# Patient Record
Sex: Female | Born: 1990 | Race: Black or African American | Hispanic: No | Marital: Single | Smoking: Never smoker
Health system: Southern US, Community
[De-identification: ages and names within clinical notes are randomized; demographics above are authoritative.]

## PROBLEM LIST (undated history)

## (undated) DIAGNOSIS — I10 Essential (primary) hypertension: Secondary | ICD-10-CM

## (undated) HISTORY — DX: Essential (primary) hypertension: I10

## (undated) HISTORY — PX: NO PAST SURGERIES: SHX2092

---

## 2015-03-14 ENCOUNTER — Encounter (HOSPITAL_COMMUNITY): Payer: Self-pay

## 2015-03-14 ENCOUNTER — Emergency Department (HOSPITAL_COMMUNITY)
Admission: EM | Admit: 2015-03-14 | Discharge: 2015-03-14 | Disposition: A | Discharge status: Home / Self Care | Payer: Self-pay | Attending: Emergency Medicine | Admitting: Emergency Medicine

## 2015-03-14 DIAGNOSIS — T7840XA Allergy, unspecified, initial encounter: Secondary | ICD-10-CM | POA: Insufficient documentation

## 2015-03-14 DIAGNOSIS — Y9289 Other specified places as the place of occurrence of the external cause: Secondary | ICD-10-CM | POA: Insufficient documentation

## 2015-03-14 DIAGNOSIS — Y998 Other external cause status: Secondary | ICD-10-CM | POA: Insufficient documentation

## 2015-03-14 DIAGNOSIS — X58XXXA Exposure to other specified factors, initial encounter: Secondary | ICD-10-CM | POA: Insufficient documentation

## 2015-03-14 DIAGNOSIS — Y9389 Activity, other specified: Secondary | ICD-10-CM | POA: Insufficient documentation

## 2015-03-14 MED ORDER — EPINEPHRINE 0.3 MG/0.3ML IJ SOAJ: 0.3000 mg | Freq: Once | INTRAMUSCULAR | Status: AC

## 2015-03-14 MED ORDER — DIPHENHYDRAMINE HCL 50 MG/ML IJ SOLN
25.0000 mg | Freq: Once | INTRAMUSCULAR | Status: AC
  Administered 2015-03-14: 25 mg via INTRAVENOUS
  Filled 2015-03-14: qty 1

## 2015-03-14 MED ORDER — METHYLPREDNISOLONE SODIUM SUCC 125 MG IJ SOLR
125.0000 mg | Freq: Once | INTRAMUSCULAR | Status: AC
  Administered 2015-03-14: 125 mg via INTRAVENOUS
  Filled 2015-03-14: qty 2

## 2015-03-14 MED ORDER — PREDNISONE 10 MG PO TABS: ORAL_TABLET | ORAL | Status: AC

## 2015-03-14 NOTE — ED Provider Notes (Signed)
CSN: [id number]     Arrival date & time 03/14/15  1328 History   First MD Initiated Contact with Patient 03/14/15 1354     Chief Complaint  Patient presents with  . Oral Swelling     (Consider location/radiation/quality/duration/timing/severity/associated sxs/prior Treatment) HPI Comments: Pt comes in with c/o possible allergic reaction. She states that she at peanuts last night and woke up in the middle of the night last night and had swelling to her upper lip and felt like she has swelling in her throats. Denies difficulty swallowing. She states that she has not had reaction previously. Hasn't taken anything for the symptoms. Didn't take any medications for the symptoms. Doesn't take any blood pressure medication  The history is provided by the patient. No language interpreter was used.    History reviewed. No pertinent past medical history. History reviewed. No pertinent past surgical history. History reviewed. No pertinent family history. Social History  Substance Use Topics  . Smoking status: Never Smoker   . Smokeless tobacco: None  . Alcohol Use: Yes     Comment: social   OB History    No data available     Review of Systems  All other systems reviewed and are negative.     Allergies  Review of patient's allergies indicates no known allergies.  Home Medications   Prior to Admission medications   Not on File   BP 147/102 mmHg  Pulse 76  Temp(Src) 98.6 F (37 C) (Oral)  Resp 20  SpO2 100%  LMP 03/10/2015 Physical Exam  Constitutional: She is oriented to person, place, and time. She appears well-developed and well-nourished.  HENT:  Right Ear: External ear normal.  Left Ear: External ear normal.  Upper lip swelling noted. No tongue or oral mucosal swelling  Cardiovascular: Normal rate and regular rhythm.   Pulmonary/Chest: Effort normal and breath sounds normal. She has no wheezes. She has no rales.  Musculoskeletal: Normal range of motion.   Neurological: She is alert and oriented to person, place, and time.  Skin: Skin is warm and dry.  Psychiatric: She has a normal mood and affect.  Nursing note and vitals reviewed.   ED Course  Procedures (including critical care time) Labs Review Labs Reviewed - No data to display  Imaging Review No results found. I have personally reviewed and evaluated these images and lab results as part of my medical decision-making.   EKG Interpretation None      MDM   Final diagnoses:  Allergic reaction, initial encounter    Pt feeling a lot better at this time and the swelling is getting better. Discussed return precautions with pt and discussed follow up with allergist. Will send home with script for prednisone and epipen. No respiratory distress    Teressa LowerVrinda Sidni Fusco, NP 03/14/15 1508  Lorre NickAnthony Allen, MD 03/18/15 2256

## 2015-03-14 NOTE — Discharge Instructions (Signed)
Return as discussed for any worsening symptoms. You can take benadryl as discussed Allergy Skin Testing WHY AM I HAVING THIS TEST? Allergy skin testing is done to check whether you have an allergy to something. Testing may be done in one of two ways:  Injecting a small amount of the substance you may be allergic to (allergen).  Applying patches to your skin. Your health care provider will determine the results of your test by checking for an allergic reaction on the skin where the allergen was injected or where the patches were applied.  HOW DO I PREPARE FOR THE TEST?  Let your health care provider know about all medicines you are taking, including vitamins, herbs, eye drops, creams, and over-the-counter medicines. Some medicines can affect test results. Your health care provider will let you know when to stop taking those medicines and when you can begin taking them again.  If you are having a patch test:  Do not apply ointments, creams, or lotion to the skin where the patch will be placed. Usually, the patches are placed on your forearm or on your back.  Bring any items that you think you are allergic to, such as cosmetics, soaps, and perfume. WILL I NEED TO DO ANYTHING AT HOME? If you will receive an injection, you will not need to do anything at home. If patches will be applied to your skin, you will need to:  Wear them for 48 hours.  Return to your health care provider's office to have them removed. Do not remove them yourself.  Avoid bathing and activities that cause heavy sweating until after the patches are removed. WHAT ARE THE REFERENCE RANGES? Reference ranges are considered healthy ranges established after testing a large group of healthy people. Reference ranges may vary among different people, labs, and hospitals.  The reference range for allergy skin testing is a swollen area of skin (wheal) less than 65m in diameter, with surrounding redness and swelling (flare) less than  120min diameter.  WHAT DO THE RESULTS MEAN?  A result within the reference range means you are probably not allergic to the allergen.  A result in which the wheal is 50m650mr more and the flare is 34m15m more means you are likely allergic to the allergen. Your health care provider will consider the results of your test in addition to your symptoms before diagnosing you with an allergy. Talk with your health care provider to discuss your results, treatment options, and if necessary, the need for more tests. Talk with your health care provider if you have any questions about your results.   This information is not intended to replace advice given to you by your health care provider. Make sure you discuss any questions you have with your health care provider.   Document Released: 04/08/2004 Document Revised: 04/06/2014 Document Reviewed: 12/26/2013 Elsevier Interactive Patient Education 2016 Else[employer].  Epinephrine Injection Epinephrine is a medicine given by injection to temporarily treat an emergency allergic reaction. It is also used to treat severe asthmatic attacks and other lung problems. The medicine helps to enlarge (dilate) the small breathing tubes of the lungs. A life-threatening, sudden allergic reaction that involves the whole body is called anaphylaxis. Because of potential side effects, epinephrine should only be used as directed by your caregiver. RISKS AND COMPLICATIONS Possible side effects of epinephrine injections include:  Chest pain.  Irregular or rapid heartbeat.  Shortness of breath.  Nausea.  Vomiting.  Abdominal pain or cramping.  Sweating.  Dizziness.  Weakness.  Headache.  Nervousness. Report all side effects to your caregiver. HOW TO GIVE AN EPINEPHRINE INJECTION Give the epinephrine injection immediately when symptoms of a severe reaction begin. Inject the medicine into the outer thigh or any available, large muscle. Your caregiver can teach you  how to do this. You do not need to remove any clothing. After the injection, call your local emergency services (911 in [place] Even if you improve after the injection, you need to be examined at a hospital emergency department. Epinephrine works quickly, but it also wears off quickly. Delayed reactions can occur. A delayed reaction may be as serious and dangerous as the initial reaction. HOME CARE INSTRUCTIONS  Make sure you and your family know how to give an epinephrine injection.  Use epinephrine injections as directed by your caregiver. Do not use this medicine more often or in larger doses than prescribed.  Always carry your epinephrine injection or anaphylaxis kit with you. This can be lifesaving if you have a severe reaction.  Store the medicine in a cool, dry place. If the medicine becomes discolored or cloudy, dispose of it properly and replace it with new medicine.  Check the expiration date on your medicine. It may be unsafe to use medicines past their expiration date.  Tell your caregiver about any other medicines you are taking. Some medicines can react badly with epinephrine.  Tell your caregiver about any medical conditions you have, such as diabetes, high blood pressure (hypertension), heart disease, irregular heartbeats, or if you are pregnant. SEEK IMMEDIATE MEDICAL CARE IF:  You have used an epinephrine injection. Call your local emergency services (911 in [place] Even if you improve after the injection, you need to be examined at a hospital emergency department to make sure your allergic reaction is under control. You will also be monitored for adverse effects from the medicine.  You have chest pain.  You have irregular or fast heartbeats.  You have shortness of breath.  You have severe headaches.  You have severe nausea, vomiting, or abdominal cramps.  You have severe pain, swelling, or redness in the area where you gave the injection.   This information is not  intended to replace advice given to you by your health care provider. Make sure you discuss any questions you have with your health care provider.   Document Released: 03/13/2000 Document Revised: 06/08/2011 Document Reviewed: 10/03/2014 Elsevier Interactive Patient Education [employer].

## 2015-03-14 NOTE — ED Notes (Signed)
Pt states ate peanuts last night.  Pt started having swelling on upper lip and noticed swelling in throat.  Pt woke up this morning and swelling has worsened.  States difficulty with swallowing.  Pt able to speak in full sentences without shortness of breath.  Has eaten peanut butter in past but rarely eats straight peanuts.  Unknown for previous allergy.

## 2017-01-20 ENCOUNTER — Encounter (HOSPITAL_COMMUNITY): Payer: Self-pay | Admitting: Emergency Medicine

## 2017-01-20 ENCOUNTER — Emergency Department (HOSPITAL_COMMUNITY)
Admission: EM | Admit: 2017-01-20 | Discharge: 2017-01-20 | Disposition: A | Discharge status: Home / Self Care | Payer: Self-pay | Attending: Physician Assistant | Admitting: Physician Assistant

## 2017-01-20 DIAGNOSIS — Z23 Encounter for immunization: Secondary | ICD-10-CM | POA: Insufficient documentation

## 2017-01-20 DIAGNOSIS — L0291 Cutaneous abscess, unspecified: Secondary | ICD-10-CM

## 2017-01-20 DIAGNOSIS — L02512 Cutaneous abscess of left hand: Secondary | ICD-10-CM | POA: Insufficient documentation

## 2017-01-20 MED ORDER — TETANUS-DIPHTH-ACELL PERTUSSIS 5-2.5-18.5 LF-MCG/0.5 IM SUSP
0.5000 mL | Freq: Once | INTRAMUSCULAR | Status: AC
  Administered 2017-01-20: 0.5 mL via INTRAMUSCULAR
  Filled 2017-01-20: qty 0.5

## 2017-01-20 MED ORDER — LIDOCAINE HCL (PF) 2 % IJ SOLN
INTRAMUSCULAR | Status: AC
  Administered 2017-01-20: 100 mg
  Filled 2017-01-20: qty 10

## 2017-01-20 MED ORDER — LIDOCAINE HCL 2 % IJ SOLN
5.0000 mL | Freq: Once | INTRAMUSCULAR | Status: AC
  Administered 2017-01-20: 100 mg

## 2017-01-20 NOTE — ED Notes (Signed)
Pt ambulatory and independent at discharge.  Verbalized understanding of discharge instructions 

## 2017-01-20 NOTE — ED Triage Notes (Signed)
Patient states she cut her pinky finger. This happened about three weeks ago. The cut turned into a boil.

## 2017-01-20 NOTE — ED Provider Notes (Signed)
[facility]-EMERGENCY DEPT Provider Note   CSN: [id number] Arrival date & time: 01/20/17  1833     History   Chief Complaint Chief Complaint  Patient presents with  . Recurrent Skin Infections    left pinky finger    HPI Darlene Hughes is a 26 y.o. left hand dominant female presents to the emergency department today for abscess on the left pinky finger.  She notes that she slammed her finger in a 3 weeks ago and had a pinching/cut of the skin.  Since then she has had increased swelling and now has a boil over her left pinky finger distal to the DIP and on the ulnar aspect of the finger.  She denies any surrounding erythema.  She denies any decreased range of motion, painful range of motion, numbness, tingling, drainage.  Patient tetanus status unknown.   HPI  No past medical history on file.  There are no active problems to display for this patient.   No past surgical history on file.  OB History    No data available       Home Medications    Prior to Admission medications   Medication Sig Start Date End Date Taking? Authorizing Provider  EPINEPHrine (EPIPEN 2-PAK) 0.3 mg/0.3 mL IJ SOAJ injection Inject 0.3 mLs (0.3 mg total) into the muscle once. 03/14/15   Teressa Lower, NP  predniSONE (DELTASONE) 10 MG tablet 6 day step down dose 03/14/15   Teressa Lower, NP    Family History No family history on file.  Social History Social History  Substance Use Topics  . Smoking status: Never Smoker  . Smokeless tobacco: Never Used  . Alcohol use Yes     Comment: social     Allergies   Patient has no known allergies.   Review of Systems Review of Systems  Constitutional: Negative for chills and fever.  Gastrointestinal: Negative for nausea and vomiting.  Musculoskeletal: Negative for arthralgias, gait problem and joint swelling.  Skin:       Abscess  Neurological: Negative for weakness and numbness.    Physical Exam Updated  Vital Signs BP (!) 149/99 (BP Location: Left Arm)   Pulse 79   Temp 99.1 F (37.3 C) (Oral)   Resp 18   Ht 5\' 2"  (1.575 m)   Wt 97 kg (213 lb 12.8 oz)   LMP 01/20/2017   SpO2 100%   BMI 39.10 kg/m   Physical Exam  Constitutional: She appears well-developed and well-nourished.  HENT:  Head: Normocephalic and atraumatic.  Right Ear: External ear normal.  Left Ear: External ear normal.  Eyes: Conjunctivae are normal. Right eye exhibits no discharge. Left eye exhibits no discharge. No scleral icterus.  Cardiovascular:  Pulses:      Radial pulses are 2+ on the right side, and 2+ on the left side.  Pulmonary/Chest: Effort normal. No respiratory distress.  Musculoskeletal:  Left hand: There is 1cm x 1 cm fluctuant mass over the left 5th digit, distal to the DIP on the ulnar side. Does not involve nail. No surrounding or overlying heat or erythema. No drainage. Fingers otherwise appear normal. No TTP over flexor sheath Finger adduction/abduction intact with 5/5 strength.   Full active and resisted ROM to flexion/extension at wrist, MCP, PIP and DIP of all fingers.  FDS/FDP intact. Radial artery 2+ with <2sec cap refill. SILT in M/U/R distributions. Grip 5/5 strength.   Neurological: She is alert. She has normal strength. No sensory deficit.  Skin: Skin  is warm, dry and intact. Capillary refill takes less than 2 seconds. No pallor.  Psychiatric: She has a normal mood and affect.  Nursing note and vitals reviewed.      ED Treatments / Results  Labs (all labs ordered are listed, but only abnormal results are displayed) Labs Reviewed - No data to display  EKG  EKG Interpretation None       Radiology No results found.  Procedures .Marland Kitchen.Incision and Drainage Date/Time: 01/20/2017 10:04 PM Performed by: Jacinto HalimMACZIS, Keen Ewalt M Authorized by: Jacinto HalimMACZIS, Chantilly Linskey M   Consent:    Consent obtained:  Verbal   Consent given by:  Patient   Risks discussed:  Bleeding, damage to other organs,  incomplete drainage, infection and pain   Alternatives discussed:  No treatment and referral Location:    Type:  Abscess   Size:  1cm   Location:  Upper extremity   Upper extremity location:  Hand   Hand location:  L hand Pre-procedure details:    Skin preparation:  Betadine Anesthesia (see MAR for exact dosages):    Anesthesia method:  Local infiltration   Local anesthetic:  Lidocaine 2% w/o epi Procedure type:    Complexity:  Simple Procedure details:    Incision types:  Stab incision   Scalpel blade:  11   Wound management:  Probed and deloculated and irrigated with saline   Drainage:  Bloody and purulent   Drainage amount:  Scant   Wound treatment:  Wound left open   Packing materials:  None Post-procedure details:    Patient tolerance of procedure:  Tolerated well, no immediate complications   (including critical care time)  Medications Ordered in ED Medications  lidocaine (XYLOCAINE) 2 % (with pres) injection 100 mg (100 mg Infiltration Given by Other 01/20/17 2052)  Tdap (BOOSTRIX) injection 0.5 mL (0.5 mLs Intramuscular Given 01/20/17 2117)     Initial Impression / Assessment and Plan / ED Course  I have reviewed the triage vital signs and the nursing notes.  Pertinent labs & imaging results that were available during my care of the patient were reviewed by me and considered in my medical decision making (see chart for details).     Patient with abscess over left pinky finger. No tendon sheath pain on palpation on exam. Intact rom of the digits to flexion and extension without pain. Patient is NVI. Patient with skin abscess amenable to incision and drainage.  Abscess with minimal purulent/bloody drainage. Question of abscess vs cyst. Area was not large enough to warrant packing or drain,  wound recheck in 2 days. Encouraged home warm soaks and flushing.  No signs of cellulitis is surrounding skin.  Will d/c to home.  No antibiotic therapy is indicated.  Final  Clinical Impressions(s) / ED Diagnoses   Final diagnoses:  Abscess    New Prescriptions Discharge Medication List as of 01/20/2017 10:06 PM       Jacinto HalimMaczis, Bennie Chirico M, PA-C 01/20/17 2357    Corlis LeakMackuen, Cindee Saltourteney Lyn, MD 01/23/17 [phone]

## 2017-01-20 NOTE — Discharge Instructions (Signed)
Please read and follow all provided instructions.  You were seen here today for an Abscess. For this, an incision and drainage (aka an I&D) to the affected area was done today. An I&D is a surgical procedure to open and drain a fluid-filled sac that may be filled with pus, mucus, or blood. Examples of fluid-filled sacs that may need surgical drainage include cysts, skin infections (abscesses), and red lumps that develop from a ruptured cyst or a small abscess (boils).  Home instructions  1. Treatment: Keep wound clean and dry. Apply warm compresses to throughout the day. It will continue to drain over the follow days.  For pain control you may take: 800mg  of ibuprofen (that is usually four 200mg  over the counter pills) up to 3 times a day (please take with food) and acetaminophen 975mg  (this is 3 normal strength, 325mg , over the counter pills) up to four times a day. Please do not take more than this. Do not drink alcohol or combine with other medications that have acetaminophen as an ingredient (Read the labels!).    Follow Up:  [facility] wound recheck   Return instructions:  Return to the Emergency Department if you have: Fever You have more redness, swelling, or pain around your incision.  Your incision feels warm to touch Redness of the skin that moves away from the affected area, especially if it streaks away from the affected area  The area where the incision and drainage occurred becomes numb or it tingles. Any other emergent concerns  Your vital signs today were: BP (!) 149/99 (BP Location: Left Arm)    Pulse 79    Temp 99.1 F (37.3 C) (Oral)    Resp 18    Ht 5\' 2"  (1.575 m)    Wt 97 kg (213 lb 12.8 oz)    LMP 01/20/2017    SpO2 100%    BMI 39.10 kg/m  If your blood pressure (BP) was elevated above 135/85 this visit, please have this repeated by your doctor within one month. ---------------

## 2017-09-07 ENCOUNTER — Other Ambulatory Visit: Payer: Self-pay

## 2017-09-07 ENCOUNTER — Emergency Department (HOSPITAL_COMMUNITY)
Admission: EM | Admit: 2017-09-07 | Discharge: 2017-09-07 | Disposition: A | Discharge status: Home / Self Care | Payer: No Typology Code available for payment source | Attending: Emergency Medicine | Admitting: Emergency Medicine

## 2017-09-07 ENCOUNTER — Encounter (HOSPITAL_COMMUNITY): Payer: Self-pay

## 2017-09-07 ENCOUNTER — Emergency Department (HOSPITAL_COMMUNITY): Payer: No Typology Code available for payment source

## 2017-09-07 DIAGNOSIS — Z79899 Other long term (current) drug therapy: Secondary | ICD-10-CM | POA: Diagnosis not present

## 2017-09-07 DIAGNOSIS — M79673 Pain in unspecified foot: Secondary | ICD-10-CM | POA: Diagnosis not present

## 2017-09-07 DIAGNOSIS — Z041 Encounter for examination and observation following transport accident: Secondary | ICD-10-CM | POA: Insufficient documentation

## 2017-09-07 DIAGNOSIS — R079 Chest pain, unspecified: Secondary | ICD-10-CM | POA: Diagnosis present

## 2017-09-07 MED ORDER — KETOROLAC TROMETHAMINE 60 MG/2ML IM SOLN
60.0000 mg | Freq: Once | INTRAMUSCULAR | Status: AC
  Administered 2017-09-07: 60 mg via INTRAMUSCULAR
  Filled 2017-09-07: qty 2
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
## 2018-03-29 ENCOUNTER — Emergency Department (HOSPITAL_COMMUNITY): Payer: Self-pay

## 2018-03-29 ENCOUNTER — Other Ambulatory Visit (HOSPITAL_COMMUNITY): Payer: Medicaid Other

## 2018-03-29 ENCOUNTER — Other Ambulatory Visit: Payer: Self-pay

## 2018-03-29 ENCOUNTER — Emergency Department (HOSPITAL_COMMUNITY)
Admission: EM | Admit: 2018-03-29 | Discharge: 2018-03-29 | Disposition: A | Discharge status: Home / Self Care | Payer: Self-pay | Attending: Emergency Medicine | Admitting: Emergency Medicine

## 2018-03-29 ENCOUNTER — Encounter (HOSPITAL_COMMUNITY): Payer: Self-pay | Admitting: Emergency Medicine

## 2018-03-29 DIAGNOSIS — O9989 Other specified diseases and conditions complicating pregnancy, childbirth and the puerperium: Secondary | ICD-10-CM | POA: Insufficient documentation

## 2018-03-29 DIAGNOSIS — R102 Pelvic and perineal pain: Secondary | ICD-10-CM | POA: Insufficient documentation

## 2018-03-29 DIAGNOSIS — Z3A00 Weeks of gestation of pregnancy not specified: Secondary | ICD-10-CM | POA: Insufficient documentation

## 2018-03-29 DIAGNOSIS — R1032 Left lower quadrant pain: Secondary | ICD-10-CM

## 2018-03-29 DIAGNOSIS — R1031 Right lower quadrant pain: Secondary | ICD-10-CM

## 2018-03-29 DIAGNOSIS — R1030 Lower abdominal pain, unspecified: Secondary | ICD-10-CM | POA: Insufficient documentation

## 2018-03-29 DIAGNOSIS — Z3A01 Less than 8 weeks gestation of pregnancy: Secondary | ICD-10-CM

## 2018-03-29 LAB — CBC WITH DIFFERENTIAL/PLATELET
Abs Immature Granulocytes: 0.14 10*3/uL — ABNORMAL HIGH (ref 0.00–0.07)
Basophils Absolute: 0 10*3/uL (ref 0.0–0.1)
Basophils Relative: 0 %
EOS ABS: 0.1 10*3/uL (ref 0.0–0.5)
EOS PCT: 1 %
HEMATOCRIT: 41.6 % (ref 36.0–46.0)
HEMOGLOBIN: 12.9 g/dL (ref 12.0–15.0)
Immature Granulocytes: 1 %
LYMPHS ABS: 3.4 10*3/uL (ref 0.7–4.0)
LYMPHS PCT: 22 %
MCH: 27.2 pg (ref 26.0–34.0)
MCHC: 31 g/dL (ref 30.0–36.0)
MCV: 87.6 fL (ref 80.0–100.0)
MONO ABS: 0.9 10*3/uL (ref 0.1–1.0)
Monocytes Relative: 6 %
Neutro Abs: 11.2 10*3/uL — ABNORMAL HIGH (ref 1.7–7.7)
Neutrophils Relative %: 70 %
Platelets: 209 10*3/uL (ref 150–400)
RBC: 4.75 MIL/uL (ref 3.87–5.11)
RDW: 13 % (ref 11.5–15.5)
WBC: 15.8 10*3/uL — AB (ref 4.0–10.5)
nRBC: 0 % (ref 0.0–0.2)

## 2018-03-29 LAB — COMPREHENSIVE METABOLIC PANEL
ALT: 19 U/L (ref 0–44)
ANION GAP: 9 (ref 5–15)
AST: 26 U/L (ref 15–41)
Albumin: 4.3 g/dL (ref 3.5–5.0)
Alkaline Phosphatase: 49 U/L (ref 38–126)
BILIRUBIN TOTAL: 0.3 mg/dL (ref 0.3–1.2)
BUN: 7 mg/dL (ref 6–20)
CALCIUM: 9.6 mg/dL (ref 8.9–10.3)
CO2: 24 mmol/L (ref 22–32)
Chloride: 106 mmol/L (ref 98–111)
Creatinine, Ser: 0.79 mg/dL (ref 0.44–1.00)
GFR calc non Af Amer: >60 mL/min (ref >60)
Glucose, Bld: 96 mg/dL (ref 70–99)
Potassium: 3.5 mmol/L (ref 3.5–5.1)
Sodium: 139 mmol/L (ref 135–145)
TOTAL PROTEIN: 7.3 g/dL (ref 6.5–8.1)

## 2018-03-29 LAB — URINALYSIS, ROUTINE W REFLEX MICROSCOPIC
Bilirubin Urine: NEGATIVE
Glucose, UA: NEGATIVE mg/dL
Hgb urine dipstick: NEGATIVE
KETONES UR: NEGATIVE mg/dL
LEUKOCYTES UA: NEGATIVE
NITRITE: NEGATIVE
PROTEIN: NEGATIVE mg/dL
Specific Gravity, Urine: 1.028 (ref 1.005–1.030)
pH: 5 (ref 5.0–8.0)

## 2018-03-29 LAB — HCG, QUANTITATIVE, PREGNANCY: hCG, Beta Chain, Quant, S: 2085 m[IU]/mL — ABNORMAL HIGH (ref <5)

## 2018-03-29 LAB — I-STAT BETA HCG BLOOD, ED (MC, WL, AP ONLY): HCG, QUANTITATIVE: 1945.9 m[IU]/mL — AB (ref <5)

## 2018-03-29 LAB — LIPASE, BLOOD: LIPASE: 20 U/L (ref 11–51)

## 2018-03-29 NOTE — ED Provider Notes (Signed)
[facility]   CSN: [id number] Arrival date & time: 03/29/18  1236   History   Chief Complaint Chief Complaint  Patient presents with  . Abdominal Pain    HPI Darlene EwingJasmine Hughes is a 27 y.o. female with no significant past medical history who presents for evaluation of abdominal pain.  Patient states pain is been present x2 weeks.  Pain is intermittent in nature.  Describes sensation as generalized abdominal cramping.  Has not taken anything for symptoms.  She rates her pain a 4/10.  Pain does not radiate.  Denies fever, chills, nausea, vomiting, cough, chest pain, shortness of breath, pelvic pain, vaginal discharge, diarrhea, constipation, dysuria, concerns for STDs.  Patient states she is also had breast tenderness over the last 2 weeks.  Last menstrual cycle 03/19/2018, however "very light spotting.".  Patient is sexually active and does not use birth control.  States she does not want testing for STDs at this time.  Has been able to tolerate p.o. intake without difficulty.  History obtained from patient.  No interpreter was used.  HPI  History reviewed. No pertinent past medical history.  There are no active problems to display for this patient.   History reviewed. No pertinent surgical history.   OB History   No obstetric history on file.      Home Medications    Prior to Admission medications   Medication Sig Start Date End Date Taking? Authorizing Provider  calcium carbonate (TUMS EX) 750 MG chewable tablet Chew 1-2 tablets by mouth as needed for heartburn (or indigestion).    Yes [provider]  EPINEPHrine (EPIPEN 2-PAK) 0.3 mg/0.3 mL IJ SOAJ injection Inject 0.3 mLs (0.3 mg total) into the muscle once. Patient not taking: Reported on 03/29/2018 03/14/15   Teressa LowerPickering, Vrinda, NP  predniSONE (DELTASONE) 10 MG tablet 6 day step down dose Patient not taking: Reported on 03/29/2018 03/14/15   Teressa LowerPickering,  Vrinda, NP    Family History No family history on file.  Social History Social History   Tobacco Use  . Smoking status: Never Smoker  . Smokeless tobacco: Never Used  Substance Use Topics  . Alcohol use: Yes    Comment: social  . Drug use: No     Allergies   Patient has no known allergies.   Review of Systems Review of Systems  Constitutional: Negative.   HENT: Negative.   Respiratory: Negative.   Cardiovascular: Negative.   Gastrointestinal: Positive for abdominal pain. Negative for abdominal distention, anal bleeding, blood in stool, constipation, diarrhea, nausea, rectal pain and vomiting.  Genitourinary: Negative.   Musculoskeletal: Negative.   Skin: Negative.   Neurological: Negative.   All other systems reviewed and are negative.    Physical Exam Updated Vital Signs BP (!) 147/84   Pulse 98   Temp 98 F (36.7 C) (Oral)   Resp 17   Ht 5\' 4"  (1.626 m)   Wt 96.6 kg   LMP 03/19/2018   SpO2 100%   BMI 36.56 kg/m   Physical Exam Vitals signs and nursing note reviewed.  Constitutional:      General: She is not in acute distress.    Appearance: She is well-developed. She is not ill-appearing, toxic-appearing or diaphoretic.     Comments: Patient sitting in bed talking to friends in room initial evaluation.  She does not appear in any acute distress.  HENT:     Head: Normocephalic and atraumatic.     Mouth/Throat:  Lips: Pink.     Mouth: Mucous membranes are moist.     Pharynx: Oropharynx is clear. Uvula midline.     Tonsils: No tonsillar exudate.  Eyes:     Pupils: Pupils are equal, round, and reactive to light.  Neck:     Musculoskeletal: Normal range of motion.  Cardiovascular:     Rate and Rhythm: Normal rate.     Pulses: Normal pulses.     Heart sounds: Normal heart sounds. No murmur. No friction rub. No gallop.   Pulmonary:     Effort: Pulmonary effort is normal. No tachypnea or respiratory distress.     Breath sounds: Normal breath  sounds. No stridor, decreased air movement or transmitted upper airway sounds. No decreased breath sounds, wheezing, rhonchi or rales.  Abdominal:     General: Bowel sounds are normal. There is no distension.     Palpations: Abdomen is soft. There is no shifting dullness.     Tenderness: There is abdominal tenderness in the right lower quadrant, suprapubic area and left lower quadrant. There is no right CVA tenderness, left CVA tenderness, guarding or rebound. Negative signs include Murphy's sign, McBurney's sign and psoas sign.     Hernia: No hernia is present.     Comments: Soft with mild generalized abdominal tenderness.  Negative Murphy sign.  No rebound or guarding.  Musculoskeletal: Normal range of motion.     Right lower leg: No edema.     Left lower leg: No edema.     Comments: Moves all extremities without difficulty.  Skin:    General: Skin is warm and dry.     Comments: No rashes or lesions.  Neurological:     Mental Status: She is alert.      ED Treatments / Results  Labs (all labs ordered are listed, but only abnormal results are displayed) Labs Reviewed  CBC WITH DIFFERENTIAL/PLATELET - Abnormal; Notable for the following components:      Result Value   WBC 15.8 (*)    Neutro Abs 11.2 (*)    Abs Immature Granulocytes 0.14 (*)    All other components within normal limits  HCG, QUANTITATIVE, PREGNANCY - Abnormal; Notable for the following components:   hCG, Beta Chain, Quant, S 2,085 (*)    All other components within normal limits  I-STAT BETA HCG BLOOD, ED ([facility], [facility], [facility] ONLY) - Abnormal; Notable for the following components:   I-stat hCG, quantitative 1,945.9 (*)    All other components within normal limits  COMPREHENSIVE METABOLIC PANEL  URINALYSIS, ROUTINE W REFLEX MICROSCOPIC  LIPASE, BLOOD    EKG None  Radiology [place] Ob Less Than 14 Weeks With Ob Transvaginal  Result Date: 03/29/2018 CLINICAL DATA:  Bilateral lower abdominal cramping and first-trimester  pregnancy. Clinical dates is not known. EXAM: OBSTETRIC <14 WK US AND TRANSVAGINAL OB US TECHNIQUE: Both transabdominal and transvaginal ultrasound examinations were performed for complete evaluation of the gestation as well as the maternal uterus, adnexal regions, and pelvic cul-de-sac. Transvaginal technique was performed to assess early pregnancy. COMPARISON:  None. FINDINGS: No intra or extrauterine gestational sac is seen. No adnexal mass or free pelvic fluid. The endometrium is thickened to 3 cm and mildly heterogeneous. Hypoechoic mass within the myometrium measuring 2 cm, without endometrial distortion. IMPRESSION: Pregnancy of unknown location. Differential considerations include intrauterine gestation too early to be sonographically visualized, spontaneous abortion, or ectopic pregnancy. Consider follow-up ultrasound in 10 days and serial quantitative beta HCG follow-up. 2 cm intramural fibroid  Electronically Signed   By: Marnee Spring M.D.   On: 03/29/2018 17:52    Procedures Procedures (including critical care time)  Medications Ordered in ED Medications - No data to display   Initial Impression / Assessment and Plan / ED Course  I have reviewed the triage vital signs and the nursing notes.  Pertinent labs & imaging results that were available during my care of the patient were reviewed by me and considered in my medical decision making (see chart for details).  27 year old female who appears otherwise well presents for evaluation of abdominal cramping and breast tenderness. Symptom onset 2 weeks ago.  Has not take anything for symptoms.  Able to tolerate p.o. intake without difficulty.  Last menstrual cycle 03/19/2018.  She is sexually active and does not use protection.  Abdomen soft, mild lower abdominal tenderness, however there is no rebound or guarding.  Negative Murphy's sign, negative McBurney point.  Moves all extremities without difficulty.  Will obtain labs, urine and  reevaluate.  Patient does not want anything for pain at this time.  She rates her current pain a 3/10.  CBC with WBC at 15.8, urinalysis negative for infection, metabolic panel without any evidence of electrolyte, renal or liver abnormalities, lipase 20, hCG 1,945.  Given positive HCG will obtain ultrasound.  Pelvic ultrasound without definitive intrauterine or extrauterine gestational sac. Pregnancy of unknown location, possibly due to early gestation, SAB or ectopic pregnancy.  Recommendation to follow-up ultrasound 10 days as well as serial quant hCG.  Will consult with OB/GYN given ultrasound results.  I have consulted with Dr. Adrian Blackwater, OB/GYN.  He recommends obtaining quant hCG and follow-up outpatient.  Patient is hemodynamically stable at this time.  She has had no vaginal bleeding and has no current abdominal pain.  She has been able to tolerate p.o. intake without difficulty.  No focal abdominal tenderness.  Does not want pelvic exam or STD screen at this time.  Reevaluation abdomen soft, nontender without rebound or guarding.  She does not meet Sirs or sepsis criteria.   Patient is nontoxic, nonseptic appearing, in no apparent distress. On repeat exam patient does not have a surgical abdomin and there are no peritoneal signs.  No indication of appendicitis, bowel obstruction, bowel perforation, cholecystitis, diverticulitis.  Patient discharged home with symptomatic treatment and given strict instructions for follow-up with ObGyn for serial quant hCG.  I have also discussed reasons to return immediately to the ER.  Patient expresses understanding and agrees with plan.    Final Clinical Impressions(s) / ED Diagnoses   Final diagnoses:  Lower abdominal pain  Less than [redacted] weeks gestation of pregnancy    ED Discharge Orders    None       Leighton Brickley A, PA-C 03/29/18 1957    Rolan Bucco, MD 03/30/18 1102

## 2018-03-29 NOTE — Discharge Instructions (Addendum)
Your evaluated today for abdominal pain and breast tenderness.  Your pregnancy test was positive.  Your abdominal ultrasound did not show an intrauterine gestation.  This could be many reasons, early in pregnancy or possible ectopic pregnancy.  You will need repeat hCG levels to be trended.  We have obtained these levels while you are in the department.  I have referred you to OB/GYN.  Please follow-up with them for reevaluation the next 2 days.

## 2018-03-29 NOTE — ED Triage Notes (Signed)
Pt. Stated, Darlene Hughes had stomach cramping, in my lower stomach.

## 2018-03-29 NOTE — ED Notes (Signed)
Pt stable, ambulatory, states understanding of discharge instructions 

## 2018-05-30 LAB — OB RESULTS CONSOLE GC/CHLAMYDIA
Chlamydia: POSITIVE
Gonorrhea: NEGATIVE

## 2018-05-30 LAB — OB RESULTS CONSOLE HGB/HCT, BLOOD
HCT: 37 (ref 29–41)
Hemoglobin: 12.1

## 2018-05-30 LAB — OB RESULTS CONSOLE HEPATITIS B SURFACE ANTIGEN: HEP B S AG: NEGATIVE

## 2018-05-30 LAB — AMB REFERRAL TO OB-GYN
Glucose, 1 hour: 144
Pap: NEGATIVE
Urine Culture, OB: NEGATIVE

## 2018-05-30 LAB — OB RESULTS CONSOLE ANTIBODY SCREEN: ANTIBODY SCREEN: NEGATIVE

## 2018-05-30 LAB — OB RESULTS CONSOLE RUBELLA ANTIBODY, IGM: Rubella: IMMUNE

## 2018-05-30 LAB — OB RESULTS CONSOLE HIV ANTIBODY (ROUTINE TESTING): HIV: NONREACTIVE

## 2018-05-30 LAB — OB RESULTS CONSOLE VARICELLA ZOSTER ANTIBODY, IGG: VARICELLA IGG: NON-IMMUNE/NOT IMMUNE

## 2018-05-30 LAB — OB RESULTS CONSOLE ABO/RH: RH Type: POSITIVE

## 2018-05-30 LAB — OB RESULTS CONSOLE RPR: RPR: NONREACTIVE

## 2018-06-02 LAB — AMB REFERRAL TO OB-GYN: Glucose 3 Hour: 95

## 2018-06-15 ENCOUNTER — Ambulatory Visit (INDEPENDENT_AMBULATORY_CARE_PROVIDER_SITE_OTHER): Payer: Self-pay | Admitting: Obstetrics & Gynecology

## 2018-06-15 ENCOUNTER — Other Ambulatory Visit: Payer: Self-pay

## 2018-06-15 ENCOUNTER — Encounter: Payer: Self-pay | Admitting: Obstetrics & Gynecology

## 2018-06-15 DIAGNOSIS — O0990 Supervision of high risk pregnancy, unspecified, unspecified trimester: Secondary | ICD-10-CM

## 2018-06-15 DIAGNOSIS — O10911 Unspecified pre-existing hypertension complicating pregnancy, first trimester: Secondary | ICD-10-CM

## 2018-06-15 DIAGNOSIS — Z3A16 16 weeks gestation of pregnancy: Secondary | ICD-10-CM

## 2018-06-15 DIAGNOSIS — O10919 Unspecified pre-existing hypertension complicating pregnancy, unspecified trimester: Secondary | ICD-10-CM

## 2018-06-15 LAB — POCT URINALYSIS DIP (DEVICE)
Bilirubin Urine: NEGATIVE
Glucose, UA: NEGATIVE mg/dL
Hgb urine dipstick: NEGATIVE
LEUKOCYTE UA: NEGATIVE
Nitrite: NEGATIVE
Protein, ur: NEGATIVE mg/dL
Specific Gravity, Urine: >=1.03 (ref 1.005–1.030)
Urobilinogen, UA: 0.2 mg/dL (ref 0.0–1.0)
pH: 5.5 (ref 5.0–8.0)

## 2018-06-15 MED ORDER — ASPIRIN EC 81 MG PO TBEC: 81.0000 mg | DELAYED_RELEASE_TABLET | Freq: Every day | ORAL | 2 refills | Status: AC

## 2018-06-15 NOTE — Progress Notes (Signed)
  Subjective:transfer from [facility] is a G2P0010 [redacted]w[redacted]d being seen today for her first obstetrical visit.  Her obstetrical history is significant for [facility]. Patient does intend to breast feed. Pregnancy history fully reviewed.  Patient reports no complaints.  Vitals:   06/15/18 1505  BP: (!) 140/91  Pulse: 85  Temp: 98.4 F (36.9 C)  Weight: 216 lb (98 kg)    HISTORY: OB History  Gravida Para Term Preterm AB Living  2 0 0 0 1 0  SAB TAB Ectopic Multiple Live Births  1 0 0 0 1    # Outcome Date GA Lbr Len/2nd Weight Sex Delivery Anes PTL Lv  2 Current           1 SAB 2013 [redacted]w[redacted]d    SAB   FD   Past Medical History:  Diagnosis Date  . Hypertension    History reviewed. No pertinent surgical history. Family History  Problem Relation Age of Onset  . Asthma Brother      Exam    Uterus:     Pelvic Exam:                                    Skin: normal coloration and turgor, no rashes    Neurologic: oriented, normal mood   Extremities: normal strength, tone, and muscle mass   HEENT PERRLA and extra ocular movement intact   Mouth/Teeth dental hygiene good   Neck supple   Cardiovascular: regular rate and rhythm   Respiratory:  appears well, vitals normal, no respiratory distress, acyanotic, normal RR, chest clear, no wheezing, crepitations, rhonchi, normal symmetric air entry   Abdomen: soft, non-tender; bowel sounds normal; no masses,  no organomegaly   Urinary:        Assessment:    Pregnancy: G2P0010 Patient Active Problem List   Diagnosis Date Noted  . Supervision of high risk pregnancy, antepartum 06/15/2018  . Chronic hypertension during pregnancy, antepartum 06/15/2018        Plan:     Initial labs drawn. Prenatal vitamins. Problem list reviewed and updated. Genetic Screening discussed and offered Panorama Ultrasound discussed; fetal survey: ordered.  Follow up in 4 weeks. 50% of 30 min visit spent on counseling and  coordination of care.  Baseline labs drawn, offered flu vaccine, ASA 81 mg daily   Scheryl Darter 06/15/2018

## 2018-06-15 NOTE — Patient Instructions (Signed)
Hypertension During Pregnancy ° °Hypertension is also called high blood pressure. High blood pressure means that the force of your blood moving in your body is too strong. When you are pregnant, this condition should be watched carefully. It can cause problems for you and your baby. °Follow these instructions at home: °Eating and drinking ° °· Drink enough fluid to keep your pee (urine) pale yellow. °· Avoid caffeine. °Lifestyle °· Do not use any products that contain nicotine or tobacco, such as cigarettes and e-cigarettes. If you need help quitting, ask your doctor. °· Do not use alcohol or drugs. °· Avoid stress. °· Rest and get plenty of sleep. °General instructions °· Take over-the-counter and prescription medicines only as told by your doctor. °· While lying down, lie on your left side. This keeps pressure off your major blood vessels. °· While sitting or lying down, raise (elevate) your feet. Try putting some pillows under your lower legs. °· Exercise regularly. Ask your doctor what kinds of exercise are best for you. °· Keep all prenatal and follow-up visits as told by your doctor. This is important. °Contact a doctor if: °· You have symptoms that your doctor told you to watch for, such as: °? Throwing up (vomiting). °? Feeling sick to your stomach (nausea). °? Headache. °Get help right away if you have: °· Very bad belly pain that does not get better with treatment. °· A very bad headache that does not get better. °· Throwing up that does not get better with treatment. °· Sudden, fast weight gain. °· Sudden swelling in your hands, ankles, or face. °· Bleeding from your vagina. °· Blood in your pee. °· Fewer movements from your baby than usual. °· Blurry vision. °· Double vision. °· Muscle twitching. °· Sudden muscle tightening (spasms). °· Trouble breathing. °· Blue fingernails or lips. °Summary °· Hypertension is also called high blood pressure. High blood pressure means that the force of your blood moving  in your body is too strong. °· When you are pregnant, this condition should be watched carefully. It can cause problems for you and your baby. °· Get help right away if you have symptoms that your doctor told you to watch for. °This information is not intended to replace advice given to you by your health care provider. Make sure you discuss any questions you have with your health care provider. °Document Released: 04/18/2010 Document Revised: 03/02/2017 Document Reviewed: 11/26/2015 °Elsevier Interactive Patient Education © 2019 [employer] ° °

## 2018-06-16 LAB — COMPREHENSIVE METABOLIC PANEL
A/G RATIO: 1.6 (ref 1.2–2.2)
ALT: 24 IU/L (ref 0–32)
AST: 26 IU/L (ref 0–40)
Albumin: 4.4 g/dL (ref 3.9–5.0)
Alkaline Phosphatase: 52 IU/L (ref 39–117)
BUN/Creatinine Ratio: 10 (ref 9–23)
BUN: 7 mg/dL (ref 6–20)
Bilirubin Total: 0.3 mg/dL (ref 0.0–1.2)
CO2: 20 mmol/L (ref 20–29)
Calcium: 9.9 mg/dL (ref 8.7–10.2)
Chloride: 99 mmol/L (ref 96–106)
Creatinine, Ser: 0.68 mg/dL (ref 0.57–1.00)
GFR calc non Af Amer: 122 mL/min/{1.73_m2} (ref >59)
GFR, EST AFRICAN AMERICAN: 141 mL/min/{1.73_m2} (ref >59)
Globulin, Total: 2.7 g/dL (ref 1.5–4.5)
Glucose: 72 mg/dL (ref 65–99)
Potassium: 4.6 mmol/L (ref 3.5–5.2)
Sodium: 140 mmol/L (ref 134–144)
Total Protein: 7.1 g/dL (ref 6.0–8.5)

## 2018-06-16 LAB — PROTEIN / CREATININE RATIO, URINE
Creatinine, Urine: 343.6 mg/dL
Protein, Ur: 18.6 mg/dL
Protein/Creat Ratio: 54 mg/g creat (ref 0–200)

## 2018-07-01 ENCOUNTER — Other Ambulatory Visit: Payer: Self-pay

## 2018-07-01 ENCOUNTER — Encounter (HOSPITAL_COMMUNITY): Payer: Self-pay

## 2018-07-01 ENCOUNTER — Ambulatory Visit (HOSPITAL_COMMUNITY): Payer: Medicaid Other | Admitting: *Deleted

## 2018-07-01 ENCOUNTER — Ambulatory Visit (HOSPITAL_COMMUNITY)
Admission: RE | Admit: 2018-07-01 | Discharge: 2018-07-01 | Disposition: A | Discharge status: Home / Self Care | Payer: Medicaid Other | Source: Ambulatory Visit | Attending: Obstetrics & Gynecology | Admitting: Obstetrics & Gynecology

## 2018-07-01 VITALS — BP 129/69 | HR 93 | Temp 98.7°F

## 2018-07-01 DIAGNOSIS — O0990 Supervision of high risk pregnancy, unspecified, unspecified trimester: Secondary | ICD-10-CM | POA: Diagnosis present

## 2018-07-01 DIAGNOSIS — O98819 Other maternal infectious and parasitic diseases complicating pregnancy, unspecified trimester: Secondary | ICD-10-CM

## 2018-07-01 DIAGNOSIS — A749 Chlamydial infection, unspecified: Secondary | ICD-10-CM | POA: Diagnosis present

## 2018-07-01 DIAGNOSIS — O10919 Unspecified pre-existing hypertension complicating pregnancy, unspecified trimester: Secondary | ICD-10-CM

## 2018-07-01 DIAGNOSIS — O359XX0 Maternal care for (suspected) fetal abnormality and damage, unspecified, not applicable or unspecified: Secondary | ICD-10-CM

## 2018-07-01 DIAGNOSIS — Z363 Encounter for antenatal screening for malformations: Secondary | ICD-10-CM

## 2018-07-01 DIAGNOSIS — Z3A18 18 weeks gestation of pregnancy: Secondary | ICD-10-CM

## 2018-07-01 DIAGNOSIS — O10912 Unspecified pre-existing hypertension complicating pregnancy, second trimester: Secondary | ICD-10-CM

## 2018-07-03 DIAGNOSIS — A749 Chlamydial infection, unspecified: Secondary | ICD-10-CM | POA: Insufficient documentation

## 2018-07-03 DIAGNOSIS — O98819 Other maternal infectious and parasitic diseases complicating pregnancy, unspecified trimester: Secondary | ICD-10-CM

## 2018-07-03 DIAGNOSIS — O359XX0 Maternal care for (suspected) fetal abnormality and damage, unspecified, not applicable or unspecified: Secondary | ICD-10-CM | POA: Insufficient documentation

## 2018-07-04 ENCOUNTER — Ambulatory Visit (HOSPITAL_COMMUNITY): Payer: No Typology Code available for payment source

## 2018-07-11 ENCOUNTER — Encounter (HOSPITAL_COMMUNITY): Payer: Self-pay

## 2018-07-20 ENCOUNTER — Telehealth: Payer: Self-pay | Admitting: Obstetrics and Gynecology

## 2018-07-20 NOTE — Telephone Encounter (Signed)
Called the patient and informed of the upcoming virtual appointment. Educated of downloading the app and ensured the patient understood. No questions at this time.

## 2018-07-21 ENCOUNTER — Other Ambulatory Visit: Payer: Self-pay

## 2018-07-21 ENCOUNTER — Telehealth: Payer: Self-pay | Admitting: Obstetrics & Gynecology

## 2018-07-21 ENCOUNTER — Ambulatory Visit: Payer: Self-pay | Admitting: Obstetrics & Gynecology

## 2018-07-21 DIAGNOSIS — O0990 Supervision of high risk pregnancy, unspecified, unspecified trimester: Secondary | ICD-10-CM

## 2018-07-21 DIAGNOSIS — O359XX0 Maternal care for (suspected) fetal abnormality and damage, unspecified, not applicable or unspecified: Secondary | ICD-10-CM

## 2018-07-21 DIAGNOSIS — O10919 Unspecified pre-existing hypertension complicating pregnancy, unspecified trimester: Secondary | ICD-10-CM

## 2018-07-21 NOTE — Patient Instructions (Signed)

## 2018-07-21 NOTE — Telephone Encounter (Signed)
Open in error

## 2018-07-26 NOTE — Telephone Encounter (Signed)
Opened in error

## 2018-07-27 ENCOUNTER — Telehealth: Payer: Self-pay | Admitting: *Deleted

## 2018-07-27 ENCOUNTER — Other Ambulatory Visit (HOSPITAL_COMMUNITY): Payer: Self-pay | Admitting: *Deleted

## 2018-07-27 DIAGNOSIS — O358XX0 Maternal care for other (suspected) fetal abnormality and damage, not applicable or unspecified: Secondary | ICD-10-CM

## 2018-07-27 DIAGNOSIS — O35BXX0 Maternal care for other (suspected) fetal abnormality and damage, fetal cardiac anomalies, not applicable or unspecified: Secondary | ICD-10-CM

## 2018-07-27 NOTE — Telephone Encounter (Signed)
Called pt to discuss babyscripts and ensure that she is able to use the app and enter her blood pressure.  Pt did not pick up.  Left a message informing pt that she was being contacted in regards to babyscripts and requesting that she take and log a blood pressure into the app.  Advised pt to call the clinic if she has difficulty with this. MyChart message sent.  

## 2018-07-29 ENCOUNTER — Other Ambulatory Visit: Payer: Self-pay

## 2018-07-29 ENCOUNTER — Ambulatory Visit (HOSPITAL_COMMUNITY): Payer: Medicaid Other | Admitting: *Deleted

## 2018-07-29 ENCOUNTER — Other Ambulatory Visit (HOSPITAL_COMMUNITY): Payer: Self-pay | Admitting: Obstetrics and Gynecology

## 2018-07-29 ENCOUNTER — Other Ambulatory Visit (HOSPITAL_COMMUNITY): Payer: Self-pay | Admitting: Maternal & Fetal Medicine

## 2018-07-29 ENCOUNTER — Encounter (HOSPITAL_COMMUNITY): Payer: Self-pay | Admitting: *Deleted

## 2018-07-29 ENCOUNTER — Ambulatory Visit (HOSPITAL_COMMUNITY)
Admission: RE | Admit: 2018-07-29 | Discharge: 2018-07-29 | Disposition: A | Discharge status: Home / Self Care | Payer: Medicaid Other | Source: Ambulatory Visit | Attending: Obstetrics and Gynecology | Admitting: Obstetrics and Gynecology

## 2018-07-29 VITALS — BP 133/74 | HR 82 | Temp 99.3°F

## 2018-07-29 DIAGNOSIS — O358XX0 Maternal care for other (suspected) fetal abnormality and damage, not applicable or unspecified: Secondary | ICD-10-CM

## 2018-07-29 DIAGNOSIS — Z362 Encounter for other antenatal screening follow-up: Secondary | ICD-10-CM

## 2018-07-29 DIAGNOSIS — I10 Essential (primary) hypertension: Secondary | ICD-10-CM | POA: Insufficient documentation

## 2018-07-29 DIAGNOSIS — O35BXX0 Maternal care for other (suspected) fetal abnormality and damage, fetal cardiac anomalies, not applicable or unspecified: Secondary | ICD-10-CM

## 2018-07-29 DIAGNOSIS — Z3A22 22 weeks gestation of pregnancy: Secondary | ICD-10-CM

## 2018-07-29 NOTE — Progress Notes (Signed)
Prescription for progesterone called to [facility] on [address] per Dr. Grace Bushy.

## 2018-08-01 ENCOUNTER — Other Ambulatory Visit (HOSPITAL_COMMUNITY): Payer: Self-pay | Admitting: *Deleted

## 2018-08-01 DIAGNOSIS — O3432 Maternal care for cervical incompetence, second trimester: Secondary | ICD-10-CM

## 2018-08-04 ENCOUNTER — Telehealth: Payer: Self-pay

## 2018-08-04 NOTE — Telephone Encounter (Signed)
Received call from [facility] Labor [facility] Delivery/ Dr. Rico Junker, stating Pt is currently inpatient, was admitted for referral for rescue cerclage, was not able to do cerclage due to dilation.

## 2018-08-05 ENCOUNTER — Encounter (HOSPITAL_COMMUNITY): Payer: Self-pay

## 2018-08-05 ENCOUNTER — Ambulatory Visit (HOSPITAL_COMMUNITY): Payer: No Typology Code available for payment source | Attending: Obstetrics and Gynecology

## 2018-08-05 ENCOUNTER — Ambulatory Visit (HOSPITAL_COMMUNITY): Admission: RE | Admit: 2018-08-05 | Payer: No Typology Code available for payment source | Source: Ambulatory Visit

## 2018-08-05 MED ORDER — LIDOCAINE HCL 1 % IJ SOLN
20.00 | INTRAMUSCULAR | Status: DC
Start: ? — End: 2018-08-05

## 2018-08-05 MED ORDER — CARBOPROST TROMETHAMINE 250 MCG/ML IM SOLN
250.00 | INTRAMUSCULAR | Status: DC
Start: ? — End: 2018-08-05

## 2018-08-05 MED ORDER — DOCUSATE SODIUM 100 MG PO CAPS
100.00 | ORAL_CAPSULE | ORAL | Status: DC
Start: 2018-08-05 — End: 2018-08-05

## 2018-08-05 MED ORDER — OXYTOCIN-SODIUM CHLORIDE 20-0.9 UT/500ML-% IV SOLN
10.00 | INTRAVENOUS | Status: DC
Start: ? — End: 2018-08-05

## 2018-08-05 MED ORDER — MISOPROSTOL 200 MCG PO TABS
1000.00 | ORAL_TABLET | ORAL | Status: DC
Start: ? — End: 2018-08-05

## 2018-08-05 MED ORDER — BENZOCAINE 20 % EX AERO
1.00 | INHALATION_SPRAY | CUTANEOUS | Status: DC
Start: ? — End: 2018-08-05

## 2018-08-05 MED ORDER — LACTATED RINGERS IV SOLN
INTRAVENOUS | Status: DC
Start: ? — End: 2018-08-05

## 2018-08-05 MED ORDER — FENTANYL CITRATE (PF) 50 MCG/ML IJ SOLN
50.00 | INTRAMUSCULAR | Status: DC
Start: ? — End: 2018-08-05

## 2018-08-05 MED ORDER — MINERAL OIL PO OIL
30.00 | TOPICAL_OIL | ORAL | Status: DC
Start: ? — End: 2018-08-05

## 2018-08-05 MED ORDER — ONDANSETRON HCL 4 MG/2ML IJ SOLN
4.00 | INTRAMUSCULAR | Status: DC
Start: ? — End: 2018-08-05

## 2018-08-05 MED ORDER — IBUPROFEN 600 MG PO TABS
600.00 | ORAL_TABLET | ORAL | Status: DC
Start: ? — End: 2018-08-05

## 2018-08-05 MED ORDER — DIPHENOXYLATE-ATROPINE 2.5-0.025 MG PO TABS
2.00 | ORAL_TABLET | ORAL | Status: DC
Start: ? — End: 2018-08-05

## 2018-08-05 MED ORDER — ACETAMINOPHEN 325 MG PO TABS
650.00 | ORAL_TABLET | ORAL | Status: DC
Start: ? — End: 2018-08-05

## 2018-08-05 MED ORDER — WITCH HAZEL-GLYCERIN EX PADS
1.00 | MEDICATED_PAD | CUTANEOUS | Status: DC
Start: ? — End: 2018-08-05

## 2018-08-05 MED ORDER — SIMETHICONE 80 MG PO CHEW
80.00 | CHEWABLE_TABLET | ORAL | Status: DC
Start: ? — End: 2018-08-05

## 2018-08-05 MED ORDER — ALUM & MAG HYDROXIDE-SIMETH 400-400-40 MG/5ML PO SUSP
15.00 | ORAL | Status: DC
Start: ? — End: 2018-08-05

## 2018-08-05 MED ORDER — TERBUTALINE SULFATE 1 MG/ML IJ SOLN
0.25 | INTRAMUSCULAR | Status: DC
Start: ? — End: 2018-08-05

## 2018-08-05 MED ORDER — MISOPROSTOL 100 MCG PO TABS
50.00 | ORAL_TABLET | ORAL | Status: DC
Start: ? — End: 2018-08-05

## 2018-08-05 MED ORDER — OXYTOCIN-SODIUM CHLORIDE 20-0.9 UT/500ML-% IV SOLN
0.50 | INTRAVENOUS | Status: DC
Start: ? — End: 2018-08-05

## 2018-08-05 MED ORDER — METHYLERGONOVINE MALEATE 0.2 MG/ML IJ SOLN
0.20 | INTRAMUSCULAR | Status: DC
Start: ? — End: 2018-08-05

## 2018-08-15 ENCOUNTER — Telehealth: Payer: Self-pay | Admitting: Obstetrics and Gynecology

## 2018-08-15 NOTE — Telephone Encounter (Signed)
Called the patient to inform of upcoming visit. Left a detailed voicemail and sending a reminder letter.

## 2018-08-23 ENCOUNTER — Encounter: Payer: Self-pay | Admitting: *Deleted

## 2018-09-02 ENCOUNTER — Ambulatory Visit (HOSPITAL_COMMUNITY): Payer: No Typology Code available for payment source

## 2018-09-05 ENCOUNTER — Encounter: Payer: Self-pay | Admitting: Obstetrics and Gynecology

## 2018-09-05 ENCOUNTER — Other Ambulatory Visit: Payer: Self-pay

## 2019-02-19 ENCOUNTER — Encounter (HOSPITAL_COMMUNITY): Payer: Self-pay

## 2019-07-08 IMAGING — US US OB < 14 WEEKS - US OB TV
1 series · 14 of 28 positions shown · non-contrast
Comparison: None.

CLINICAL DATA: Bilateral lower abdominal cramping and
first-trimester pregnancy. Clinical dates is not known.

EXAM:
OBSTETRIC <14 WK US AND TRANSVAGINAL OB US
TECHNIQUE: Both transabdominal and transvaginal ultrasound examinations were
performed for complete evaluation of the gestation as well as the
maternal uterus, adnexal regions, and pelvic cul-de-sac.
Transvaginal technique was performed to assess early pregnancy.

[Series 1: us ob < 14 weeks - us ob tv · 14 of 84 slices shown]
[im 4/84]
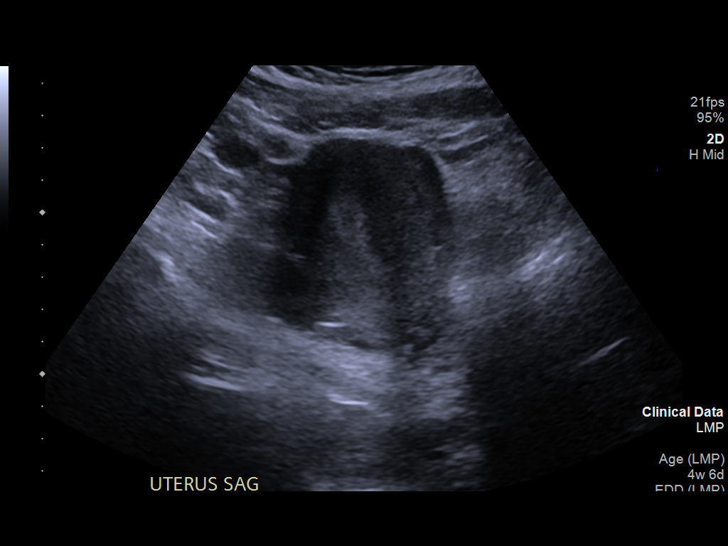
[im 10/84]
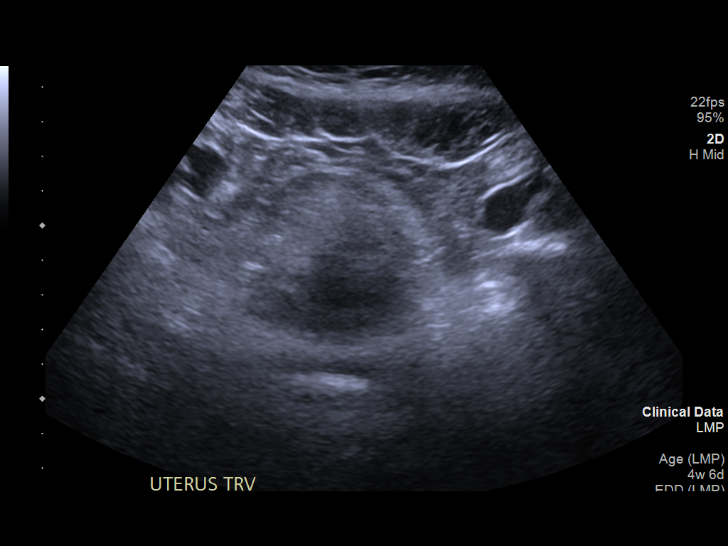
[im 16/84]
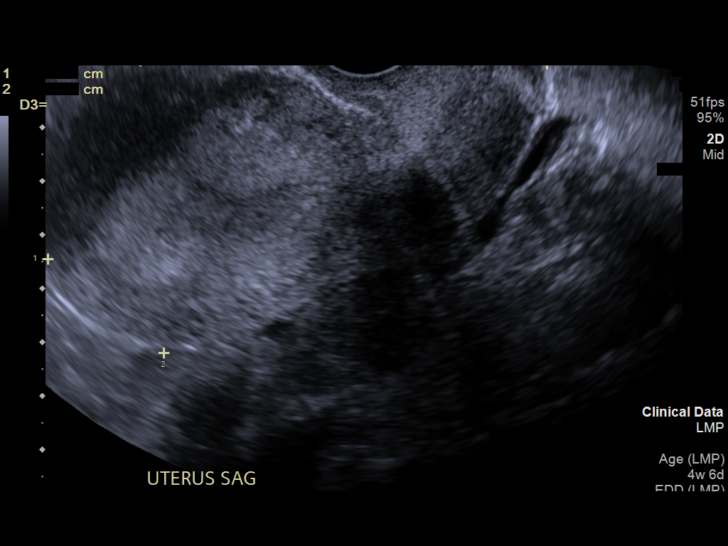
[im 22/84]
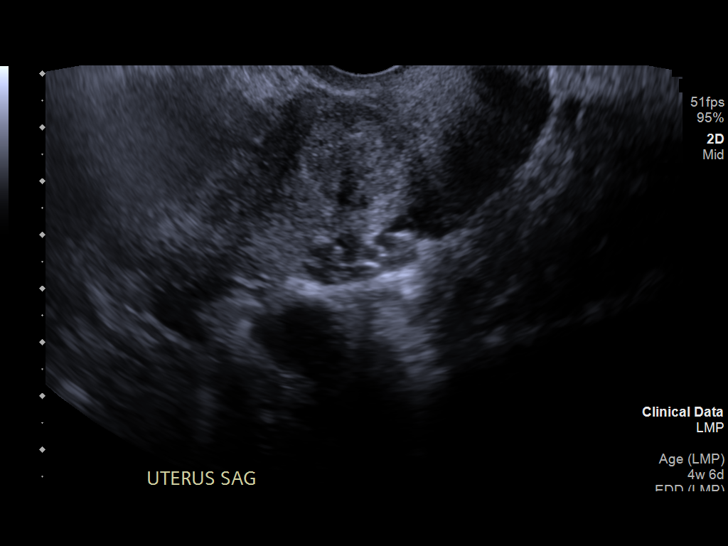
[im 28/84]
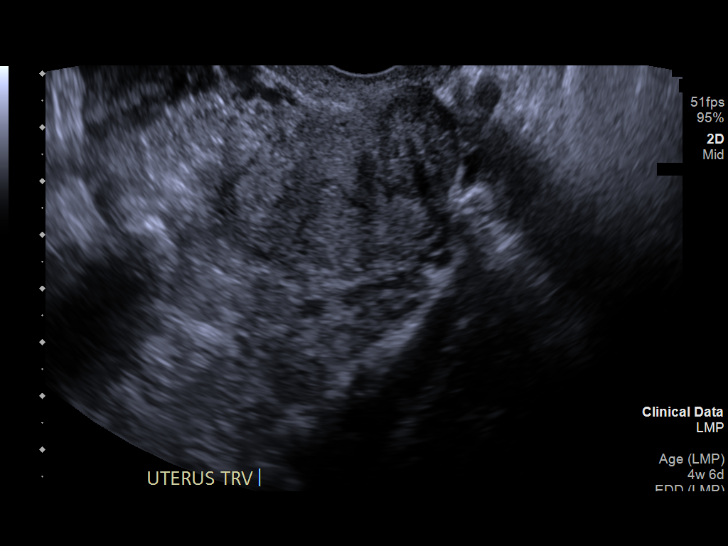
[im 34/84]
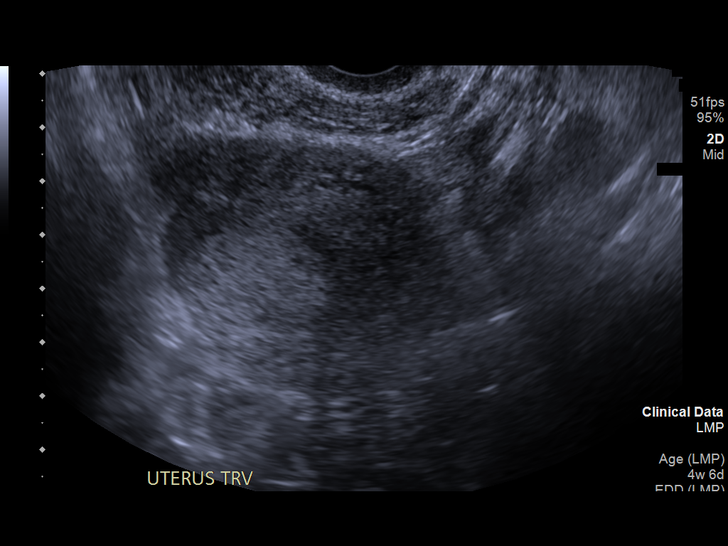
[im 40/84]
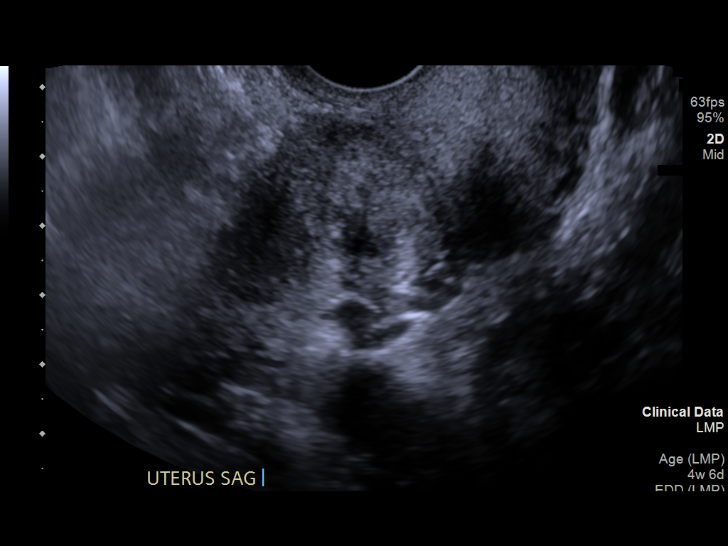
[im 47/84]
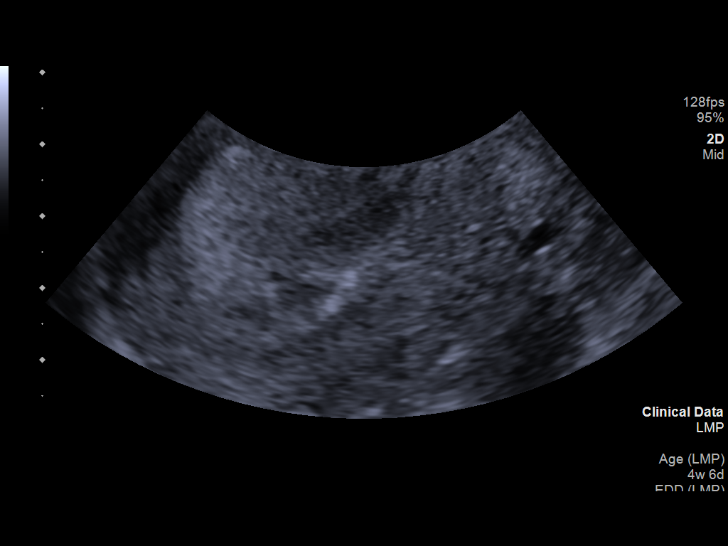
[im 53/84]
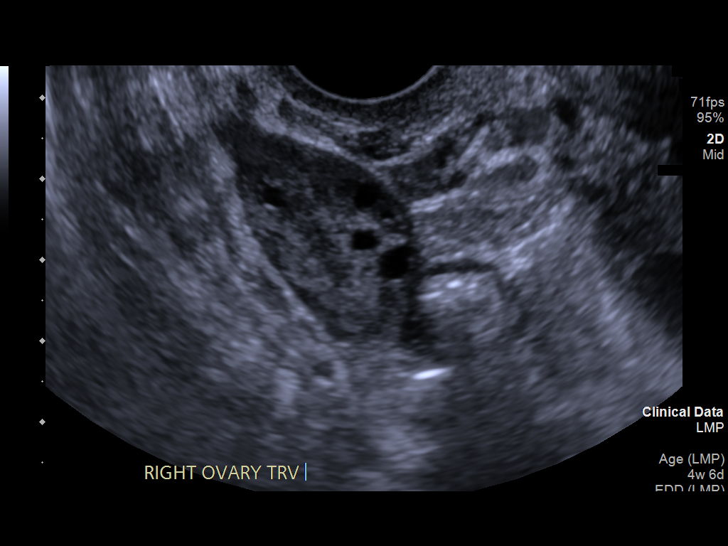
[im 59/84]
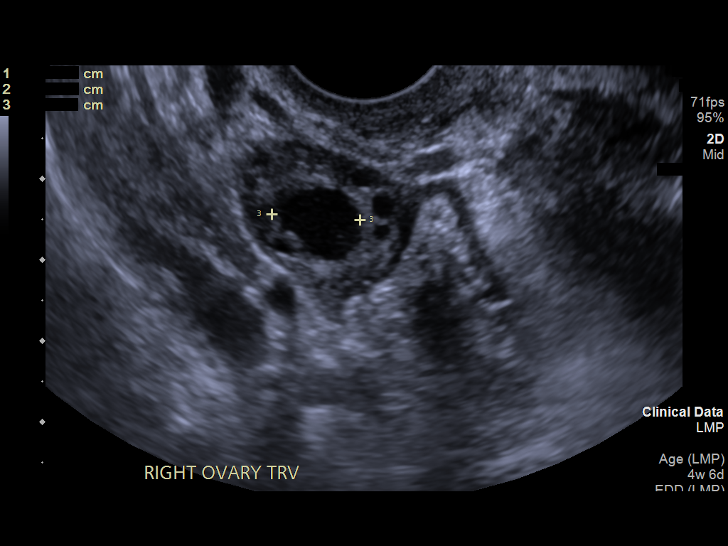
[im 65/84]
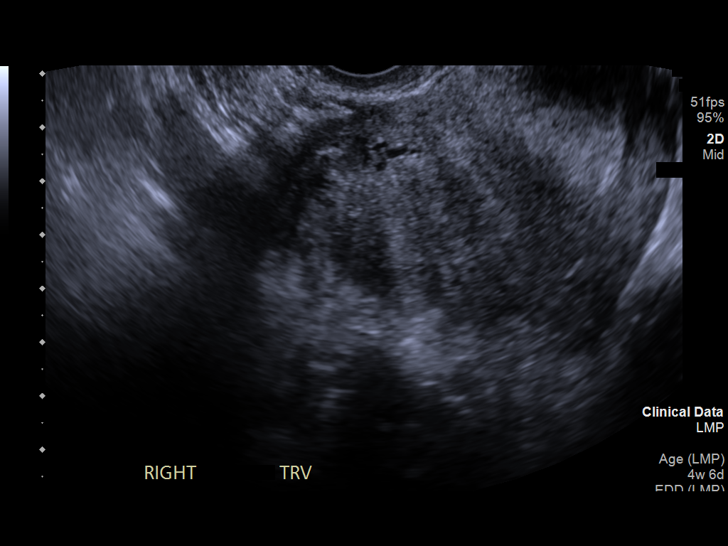
[im 71/84]
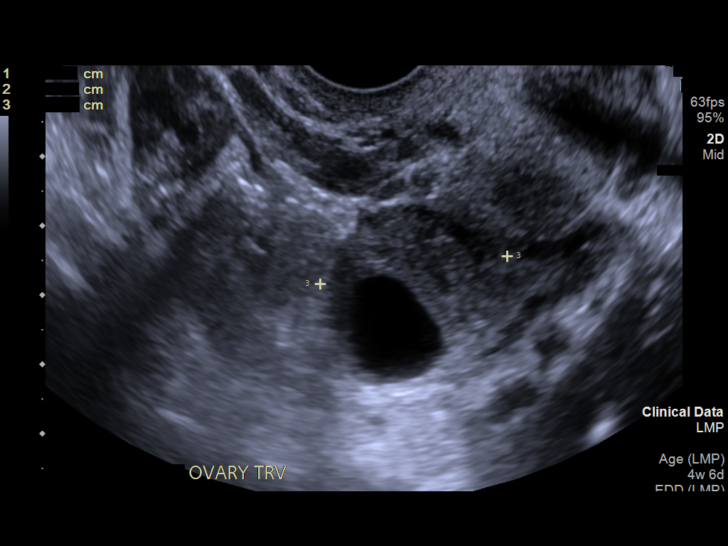
[im 77/84]
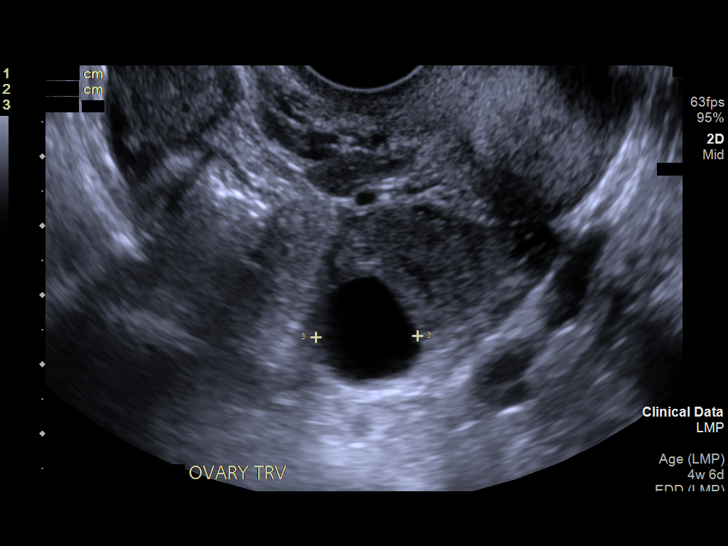
[im 84/84]
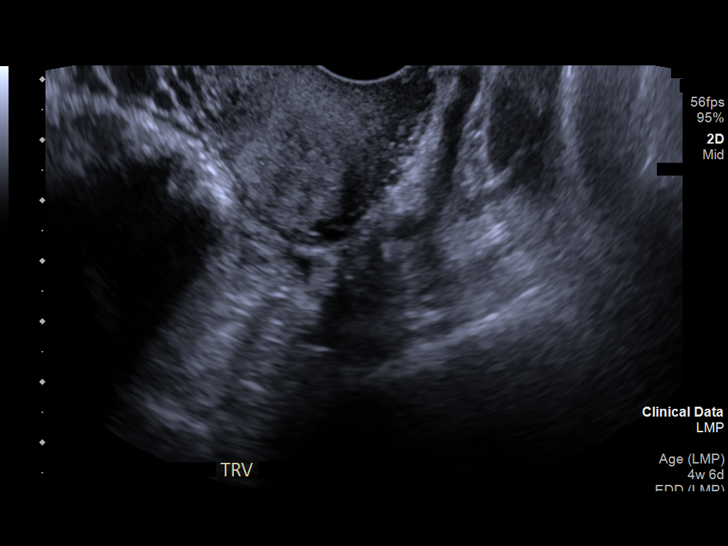

[14 of 28 positions shown; findings below may reference images not displayed]

FINDINGS: No intra or extrauterine gestational sac is seen. No adnexal mass or
free pelvic fluid. The endometrium is thickened to 3 cm and mildly
heterogeneous. Hypoechoic mass within the myometrium measuring 2 cm,
without endometrial distortion.
IMPRESSION: Pregnancy of unknown location. Differential considerations include
intrauterine gestation too early to be sonographically visualized,
spontaneous abortion, or ectopic pregnancy. Consider follow-up
ultrasound in 10 days and serial quantitative beta HCG follow-up.

2 cm intramural fibroid

## 2019-11-07 IMAGING — US US MFM OB FOLLOW UP
1 series · 13 of 28 positions shown · non-contrast
Comparison: none

[Series 1: us mfm ob follow up · 54 acquisitions, 13 frames shown]
[im 2/54]
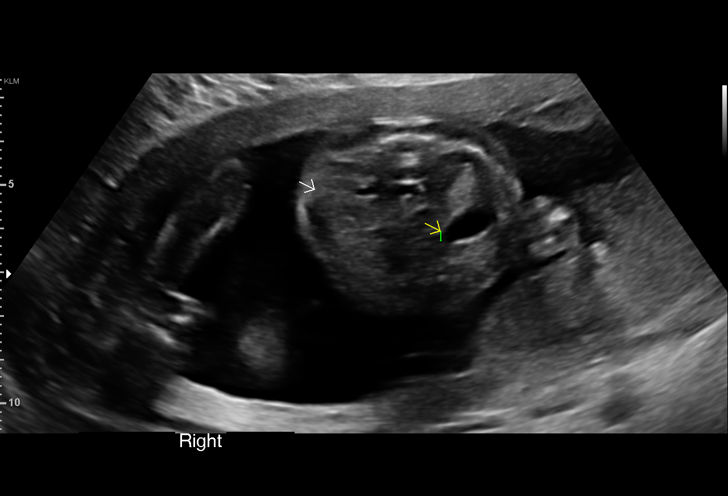
[im 6/54]
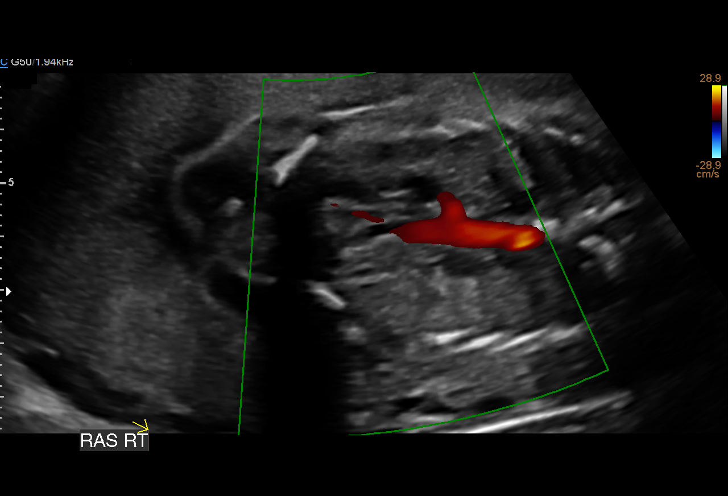
[im 10/54]
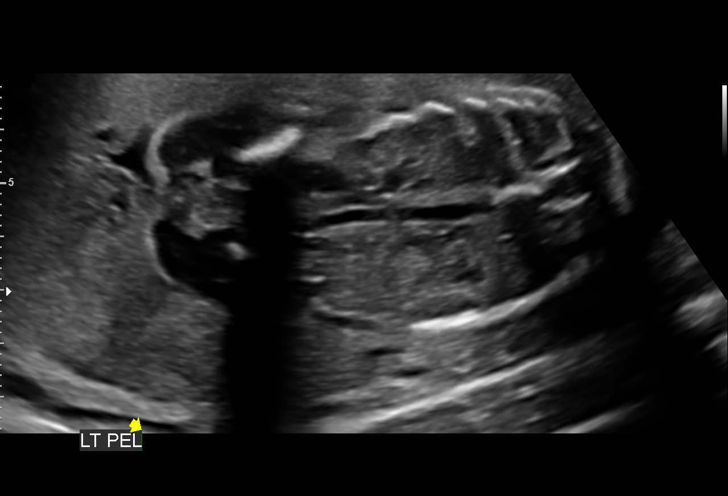
[im 14/54]
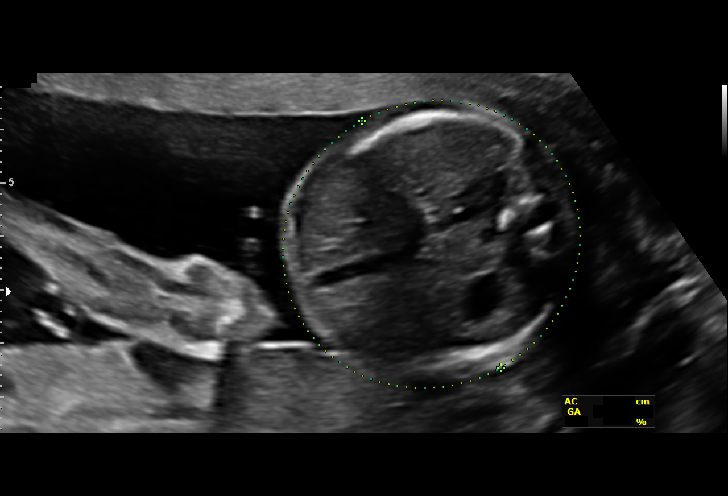
[im 18/54]
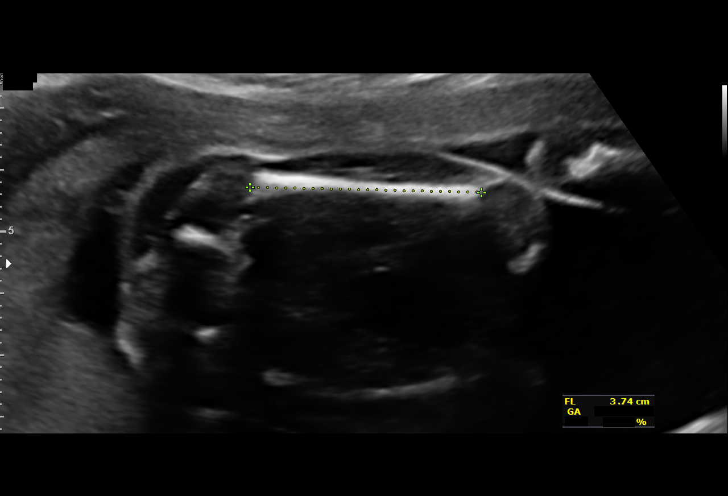
[im 22/54]
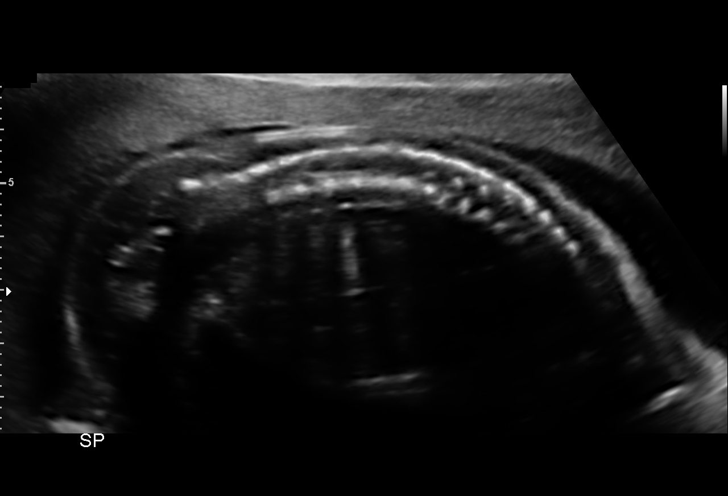
[im 28/54]
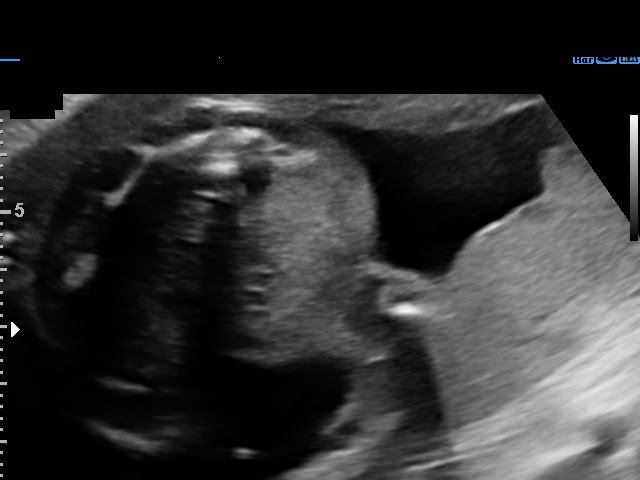
[im 32/54]
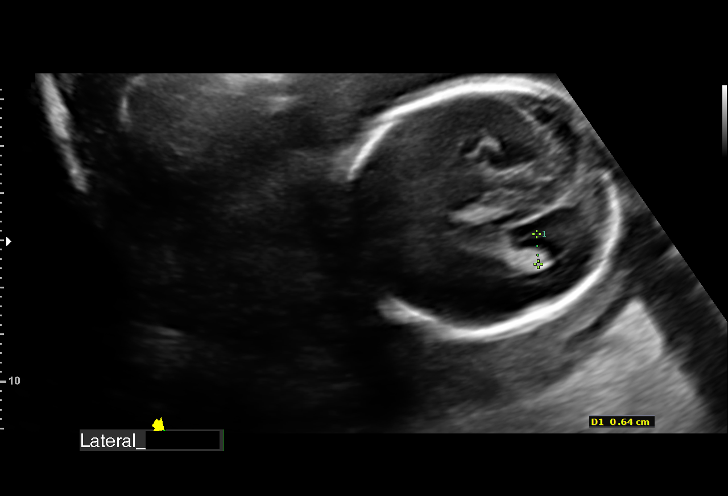
[im 36/54]
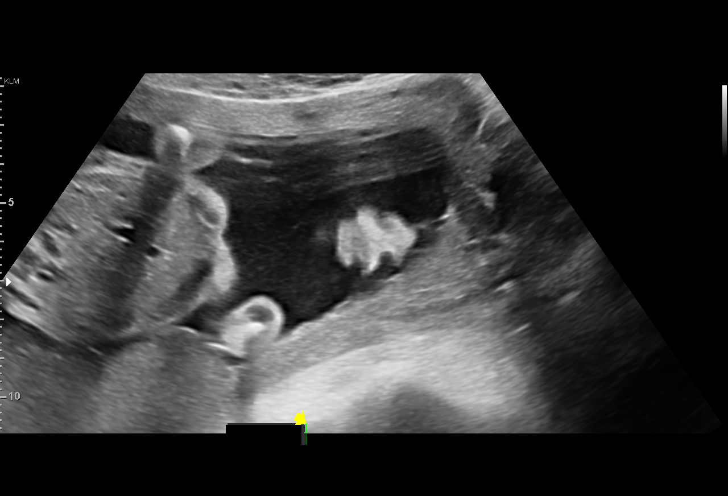
[im 40/54]
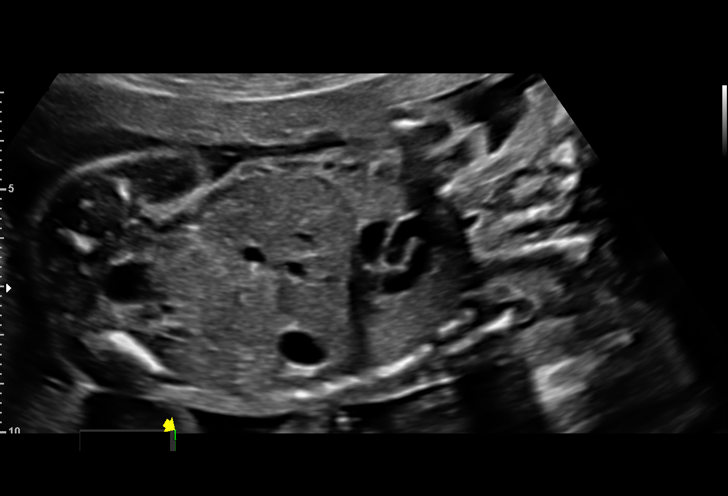
[im 44/54]
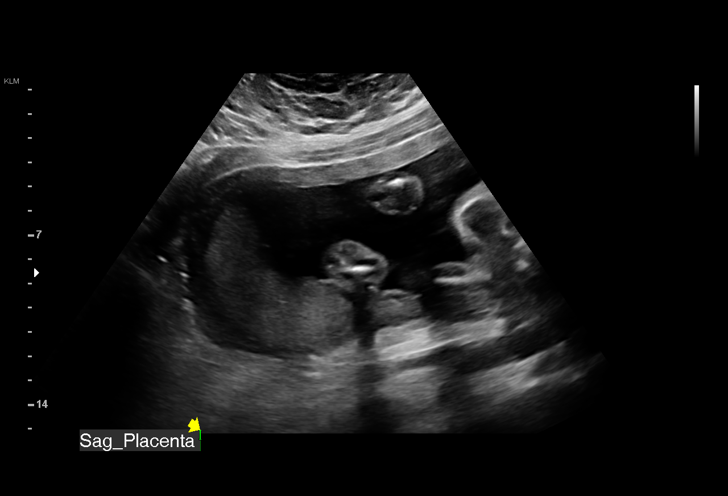
[im 48/54]
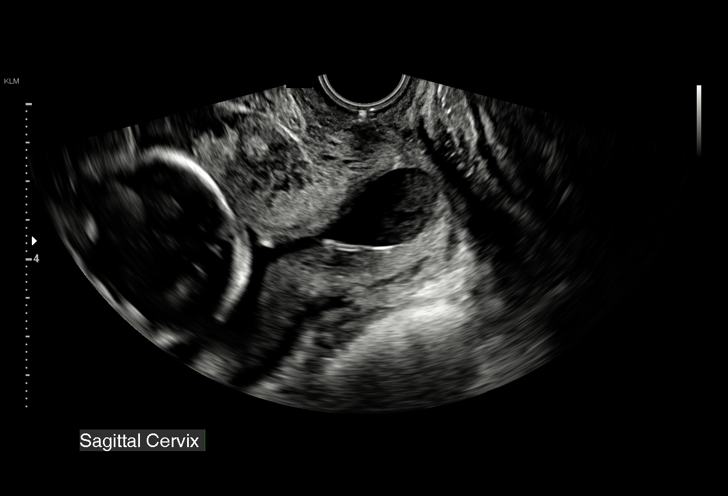
[im 52/54]
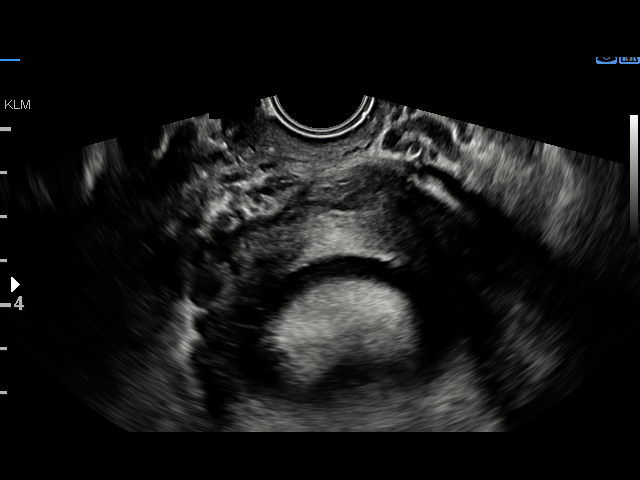

[13 of 28 positions shown; findings below may reference images not displayed]

----------------------------------------------------------------------

 ----------------------------------------------------------------------
Indications

  22 weeks gestation of pregnancy
 ----------------------------------------------------------------------
Vital Signs

                                                Height:        5'4"
Fetal Evaluation

 Num Of Fetuses:         1
 Fetal Heart Rate(bpm):  167
 Cardiac Activity:       Observed
 Presentation:           Cephalic
 Placenta:               Posterior
 P. Cord Insertion:      Previously Visualized

 Amniotic Fluid
 AFI FV:      Within normal limits
Biometry

 BPD:      51.4  mm     G. Age:  21w 4d         10  %    CI:        71.99   %    70 - 86
                                                         FL/HC:      20.4   %    19.2 -
 HC:      192.8  mm     G. Age:  21w 4d          5  %    HC/AC:      1.12        1.05 -
 AC:      171.8  mm     G. Age:  22w 1d         24  %    FL/BPD:     76.5   %    71 - 87
 FL:       39.3  mm     G. Age:  22w 4d         37  %    FL/AC:      22.9   %    20 - 24

 Est. FW:     488  gm      1 lb 1 oz     40  %
Gestational Age
 LMP:           22w 2d        Date:  02/23/18                 EDD:   11/30/18
 U/S Today:     22w 0d                                        EDD:   12/02/18
 Best:          22w 5d     Det. By:  D.O. Conception          EDD:   11/27/18
                                     (03/06/18)
Anatomy

 Cranium:               Appears normal         Aortic Arch:            Not well visualized
 Cavum:                 Appears normal         Ductal Arch:            Not well visualized
 Ventricles:            Appears normal         Diaphragm:              Appears normal
 Choroid Plexus:        Appears normal         Stomach:                Appears normal, left
                                                                       sided
 Cerebellum:            Appears normal         Abdomen:                Appears normal
 Posterior Fossa:       Appears normal         Abdominal Wall:         Appears nml (cord
                                                                       insert, abd wall)
 Nuchal Fold:           Not applicable (>20    Cord Vessels:           2 Vessel Cord
                        wks GA)
 Face:                  Orbits and profile     Kidneys:                Absent left kidney
                        previously seen
 Lips:                  Previously seen        Bladder:                Appears normal
 Thoracic:              Appears normal         Spine:                  Appears normal
 Heart:                 Abnormal, see          Upper Extremities:      Previously seen
                        comments
 RVOT:                  Not well visualized    Lower Extremities:      Previously seen
 LVOT:                  Not well visualized

 Other:  Parents do not wish to know sex of fetus. Nasal bone previously
         visualized.RT Heels and 5th digit previously visualized. Technically
         difficult due to maternal habitus and fetal position.
Impression

 Normal interval growth.
 Known tricuspid atresia with small right ventricle, absent left
 kidney and SUA. Declined amniocentesis again but opted for
 cell free DNA. Benefits and risks were discussed between
 screening vs diagnostic test.

 Cervical incompetence observed today. We noted a CL of
 <5mm. A SSE was performed the fetal membranes were
 visualized, cervical dilation was < 1 cm.

 Ms. Madrazo shared that she did have a prior 20 week loss.

 We discussed cerclage placement vs vaginal progestrone
 with providers and Ms. Madrazo.

 At this time given the known known fetal anomalies and
 uncertain genetic status vaginal progesterone was offered.

 Plan discussed with Dr. Bertini who is in agree

 ADDENDUM:
 I spoke with [facility] to discuss the case as I learned that she
 was potentially going to transfer her care there. At this time
 they agreed to consider cerclage placement given the fetal
 cardiac status and risk for preterm delivery.

 I called Ms Madrazo and she will plan to be there in 2-3
 hours as she needed to wait for her parents.
Recommendations

 To [facility] for cerclage evaluation.

 Follow-up scheduled in 1 weeks

## 2021-06-25 ENCOUNTER — Encounter: Payer: Self-pay | Admitting: Cardiology

## 2021-06-25 ENCOUNTER — Other Ambulatory Visit: Payer: Self-pay

## 2021-06-25 ENCOUNTER — Ambulatory Visit: Payer: 59 | Admitting: Cardiology

## 2021-06-25 DIAGNOSIS — I10 Essential (primary) hypertension: Secondary | ICD-10-CM

## 2021-06-25 DIAGNOSIS — R011 Cardiac murmur, unspecified: Secondary | ICD-10-CM

## 2021-06-25 MED ORDER — AMLODIPINE BESYLATE 10 MG PO TABS: 10.0000 mg | ORAL_TABLET | Freq: Every morning | ORAL | 0 refills | Status: AC

## 2021-06-25 NOTE — Progress Notes (Signed)
? ?Date:  06/25/2021  ? ?ID:  Darlene Hughes, DOB 1990/11/04, MRN [id number] ? ?PCP:  Patient, No Pcp Per (Inactive)  ?Cardiologist:  Rex Kras, DO, [facility] (established care 06/25/2021) ? ?REASON FOR CONSULT: Cardiac murmur ? ?REQUESTING PHYSICIAN:  ?Shanon Rosser, PA-C ?[address] ?[place],  [place] [address] ? ?Chief Complaint  ?Patient presents with  ? Heart Murmur  ? ? ?HPI  ?Darlene Hughes is a 31 y.o. African-American female who presents to the office with a chief complaint of " evaluation for murmur."  Past medical history includes benign essential hypertension. ? ? ?She is referred to the office at the request of Long, Nicki Reaper, PA-C for evaluation of cardiac murmur. ? ?Patient recently went to her PCP for evaluation for upper respiratory tract symptoms.  She was tested for flu which was negative.  However on physical examination was noted to have a cardiac murmur and referred to cardiology for further evaluation and management.  Patient also carries a diagnosis of hypertension but is currently not on medical therapy. ? ?Denies angina pectoris or heart failure symptoms. ? ?No family history of premature coronary disease or sudden cardiac death. ? ?FUNCTIONAL STATUS: ?2-3 times a week patient tries to do aerobic exercise, resistance training. ? ?ALLERGIES: ?No Known Allergies ? ?MEDICATION LIST PRIOR TO VISIT: ?Current Meds  ?Medication Sig  ? amLODipine (NORVASC) 10 MG tablet Take 1 tablet (10 mg total) by mouth every morning.  ?  ? ?PAST MEDICAL HISTORY: ?Past Medical History:  ?Diagnosis Date  ? Hypertension   ? ? ?PAST SURGICAL HISTORY: ?Past Surgical History:  ?Procedure Laterality Date  ? NO PAST SURGERIES    ? ? ?FAMILY HISTORY: ?The patient family history includes Asthma in her brother; Hypertension in her mother. ? ?SOCIAL HISTORY:  ?The patient  reports that she has never smoked. She has never used smokeless tobacco. She reports that she does not currently use alcohol. She reports that she  does not use drugs. ? ?REVIEW OF SYSTEMS: ?Review of Systems  ?Cardiovascular:  Negative for chest pain, dyspnea on exertion, leg swelling, palpitations and syncope.  ? ?PHYSICAL EXAM: ?Vital signs: 156/101, pulse 87, 100% on room air, weight 208 pounds, height 64 inches, temperature 98.2 ?Repeat blood pressure 146/91, heart rate 85 ? ?CONSTITUTIONAL: Well-developed and well-nourished. No acute distress.  ?SKIN: Skin is warm and dry. No rash noted. No cyanosis. No pallor. No jaundice ?HEAD: Normocephalic and atraumatic.  ?EYES: No scleral icterus ?MOUTH/THROAT: Moist oral membranes.  ?NECK: No JVD present. No thyromegaly noted. No carotid bruits  ?LYMPHATIC: No visible cervical adenopathy.  ?CHEST Normal respiratory effort. No intercostal retractions  ?LUNGS: Clear to auscultation bilaterally.  No stridor. No wheezes. No rales.  ?CARDIOVASCULAR: Regular rate and rhythm, positive D5-H2, soft systolic murmur at the left lower sternal border, no rubs or gallops appreciated. ?ABDOMINAL: Soft, nontender, nondistended, positive bowel sounds in all 4 quadrants, no apparent ascites.  ?EXTREMITIES: No peripheral edema, warm to touch, 2+ bilateral DP and PT pulses ?HEMATOLOGIC: No significant bruising ?NEUROLOGIC: Oriented to person, place, and time. Nonfocal. Normal muscle tone.  ?PSYCHIATRIC: Normal mood and affect. Normal behavior. Cooperative ? ?CARDIAC DATABASE: ?EKG: ?06/25/2021: Normal sinus rhythm, 83 bpm, low voltage, without underlying ischemia injury pattern. ? ?Echocardiogram: ?No results found for this or any previous visit from the past 1095 days. ?  ? ?Stress Testing: ?No results found for this or any previous visit from the past 1095 days. ? ? ?Heart Catheterization: ?None ? ?LABORATORY DATA: ? ?  Latest Ref Rng & Units 05/30/2018  ? 12:00 AM 03/29/2018  ?  3:53 PM  ?CBC  ?WBC 4.0 - 10.5 K/uL  15.8    ?Hemoglobin  12.1      12.9    ?Hematocrit 29 - 41 37      41.6    ?Platelets 150 - 400 K/uL  209    ?  ? This  result is from an external source.  ? ? ? ?  Latest Ref Rng & Units 06/15/2018  ?  4:25 PM 03/29/2018  ?  3:53 PM  ?CMP  ?Glucose 65 - 99 mg/dL 72   96    ?BUN 6 - 20 mg/dL 7   7    ?Creatinine 0.57 - 1.00 mg/dL 0.68   0.79    ?Sodium 134 - 144 mmol/L 140   139    ?Potassium 3.5 - 5.2 mmol/L 4.6   3.5    ?Chloride 96 - 106 mmol/L 99   106    ?CO2 20 - 29 mmol/L 20   24    ?Calcium 8.7 - 10.2 mg/dL 9.9   9.6    ?Total Protein 6.0 - 8.5 g/dL 7.1   7.3    ?Total Bilirubin 0.0 - 1.2 mg/dL 0.3   0.3    ?Alkaline Phos 39 - 117 IU/L 52   49    ?AST 0 - 40 IU/L 26   26    ?ALT 0 - 32 IU/L 24   19    ? ? ?Lipid Panel  ?No results found for: CHOL, TRIG, HDL, CHOLHDL, VLDL, LDLCALC, LDLDIRECT, LABVLDL ? ?No components found for: NTPROBNP ?No results for input(s): PROBNP in the last 8760 hours. ?No results for input(s): TSH in the last 8760 hours. ? ?BMP ?No results for input(s): NA, K, CL, CO2, GLUCOSE, BUN, CREATININE, CALCIUM, GFRNONAA, GFRAA in the last 8760 hours. ? ?HEMOGLOBIN A1C ?No results found for: HGBA1C, MPG ? ?IMPRESSION: ? ?  ICD-10-CM   ?1. Cardiac murmur  R01.1 PCV ECHOCARDIOGRAM COMPLETE  ?  ?2. Benign hypertension  I10 PCV ECHOCARDIOGRAM COMPLETE  ?  Aldosterone + renin activity w/ ratio  ?  CMP14+EGFR  ?  TSH  ?  Metanephrines, plasma  ?  Pro b natriuretic peptide (BNP)  ?  Magnesium  ?  amLODipine (NORVASC) 10 MG tablet  ?  CBC  ?  ?  ? ?RECOMMENDATIONS: ?Darlene Hughes is a 31 y.o. African-American female whose past medical history and cardiac risk factors include: Hypertension. ? ?Cardiac murmur ?Echo will be ordered to evaluate for structural heart disease and left ventricular systolic function. ? ?Benign hypertension ?Her PCPs office on 06/12/2021 patient's blood pressure was 173/114.  ?Her blood pressure is elevated at today's office visit as well. ?Patient verbalizes that she does have history of hypertension but not on medical therapy. ?Reemphasized the importance of a low-salt diet, avoiding the  use of NSAIDs, decongestants.  ?Patient is asked to keep a log of her blood pressures at home and to bring it in for review to myself or her PCP for medical therapy can further be uptitrated. ?Recommend work-up of secondary hypertension. ?We will check Aldo/renin ratio, plasma metanephrines, morning cortisol, CMP, NT proBNP. ?Recommend sleep study to evaluate for sleep apnea, patient will discuss with PCP. ?Start Norvasc 10 mg p.o. daily ? ?As part of this consultation reviewed outside office notes provided by referring physician, independently reviewed outside labs and summarized above, ordered and interpreted EKG, discussed disease management  and work-up as noted above. ? ?FINAL MEDICATION LIST END OF ENCOUNTER: ?Meds ordered this encounter  ?Medications  ? amLODipine (NORVASC) 10 MG tablet  ?  Sig: Take 1 tablet (10 mg total) by mouth every morning.  ?  Dispense:  30 tablet  ?  Refill:  0  ?  ?There are no discontinued medications.  ? ?Current Outpatient Medications:  ?  amLODipine (NORVASC) 10 MG tablet, Take 1 tablet (10 mg total) by mouth every morning., Disp: 30 tablet, Rfl: 0 ?  calcium carbonate (TUMS EX) 750 MG chewable tablet, Chew 1-2 tablets by mouth as needed for heartburn (or indigestion).  (Patient not taking: Reported on 06/25/2021), Disp: , Rfl:  ?  EPINEPHrine (EPIPEN 2-PAK) 0.3 mg/0.3 mL IJ SOAJ injection, Inject 0.3 mLs (0.3 mg total) into the muscle once. (Patient not taking: Reported on 03/29/2018), Disp: 1 Device, Rfl: 3 ?  predniSONE (DELTASONE) 10 MG tablet, 6 day step down dose (Patient not taking: Reported on 07/21/2018), Disp: 21 tablet, Rfl: 0 ?  Prenatal Vit w/Fe-Methylfol-FA (PNV PO), Take by mouth. (Patient not taking: Reported on 06/25/2021), Disp: , Rfl:  ? ?Orders Placed This Encounter  ?Procedures  ? Aldosterone + renin activity w/ ratio  ? CMP14+EGFR  ? TSH  ? Metanephrines, plasma  ? Pro b natriuretic peptide (BNP)  ? Magnesium  ? CBC  ? PCV ECHOCARDIOGRAM COMPLETE  ? ? ?There are  no Patient Instructions on file for this visit.  ? ?--Continue cardiac medications as reconciled in final medication list. ?--Return in about 4 weeks (around 07/23/2021) for Follow up, BP, Review test resu

## 2021-06-26 NOTE — Progress Notes (Signed)
EKG 06/26/2021: Normal sinus rhythm at the rate of 83 bpm, normal axis, borderline low voltage complexes.  No evidence of ischemia.

## 2021-07-05 LAB — CBC
Hematocrit: 39 % (ref 34.0–46.6)
Hemoglobin: 12.8 g/dL (ref 11.1–15.9)
MCH: 27.7 pg (ref 26.6–33.0)
MCHC: 32.8 g/dL (ref 31.5–35.7)
MCV: 84 fL (ref 79–97)
Platelets: 224 10*3/uL (ref 150–450)
RBC: 4.62 x10E6/uL (ref 3.77–5.28)
RDW: 12.3 % (ref 11.7–15.4)
WBC: 8 10*3/uL (ref 3.4–10.8)

## 2021-07-11 LAB — CMP14+EGFR
ALT: 10 IU/L (ref 0–32)
AST: 19 IU/L (ref 0–40)
Albumin/Globulin Ratio: 1.6 (ref 1.2–2.2)
Albumin: 4.5 g/dL (ref 3.9–5.0)
Alkaline Phosphatase: 90 IU/L (ref 44–121)
BUN/Creatinine Ratio: 15 (ref 9–23)
BUN: 13 mg/dL (ref 6–20)
Bilirubin Total: 0.5 mg/dL (ref 0.0–1.2)
CO2: 22 mmol/L (ref 20–29)
Calcium: 9.7 mg/dL (ref 8.7–10.2)
Chloride: 107 mmol/L — ABNORMAL HIGH (ref 96–106)
Creatinine, Ser: 0.88 mg/dL (ref 0.57–1.00)
Globulin, Total: 2.9 g/dL (ref 1.5–4.5)
Glucose: 97 mg/dL (ref 70–99)
Potassium: 4.7 mmol/L (ref 3.5–5.2)
Sodium: 142 mmol/L (ref 134–144)
Total Protein: 7.4 g/dL (ref 6.0–8.5)
eGFR: 92 mL/min/{1.73_m2} (ref >59)

## 2021-07-11 LAB — TSH: TSH: 2.04 u[IU]/mL (ref 0.450–4.500)

## 2021-07-11 LAB — MAGNESIUM: Magnesium: 1.9 mg/dL (ref 1.6–2.3)

## 2021-07-11 LAB — METANEPHRINES, PLASMA
Metanephrine, Free: <10 pg/mL (ref 0.0–88.0)
Normetanephrine, Free: 59.4 pg/mL (ref 0.0–210.1)

## 2021-07-11 LAB — ALDOSTERONE + RENIN ACTIVITY W/ RATIO
ALDOS/RENIN RATIO: 12.9 (ref 0.0–30.0)
ALDOSTERONE: 5.5 ng/dL (ref 0.0–30.0)
Renin: 0.425 ng/mL/hr (ref 0.167–5.380)

## 2021-07-11 LAB — PRO B NATRIURETIC PEPTIDE: NT-Pro BNP: <36 pg/mL (ref 0–130)

## 2021-08-01 ENCOUNTER — Ambulatory Visit: Payer: 59 | Admitting: Cardiology
